# Patient Record
Sex: Female | Born: 1990
Health system: Southern US, Community
[De-identification: ages and names within clinical notes are randomized; demographics above are authoritative.]

## PROBLEM LIST (undated history)

## (undated) DIAGNOSIS — T7840XA Allergy, unspecified, initial encounter: Secondary | ICD-10-CM

## (undated) DIAGNOSIS — R42 Dizziness and giddiness: Secondary | ICD-10-CM

## (undated) HISTORY — DX: Allergy, unspecified, initial encounter: T78.40XA

## (undated) HISTORY — PX: TONSILLECTOMY: SUR1361

## (undated) HISTORY — PX: ADENOIDECTOMY: SUR15

---

## 2015-08-29 ENCOUNTER — Encounter (HOSPITAL_BASED_OUTPATIENT_CLINIC_OR_DEPARTMENT_OTHER): Payer: Self-pay

## 2015-08-29 ENCOUNTER — Emergency Department (HOSPITAL_BASED_OUTPATIENT_CLINIC_OR_DEPARTMENT_OTHER)
Admission: EM | Admit: 2015-08-29 | Discharge: 2015-08-30 | Disposition: A | Discharge status: Home / Self Care | Payer: Self-pay | Attending: Emergency Medicine | Admitting: Emergency Medicine

## 2015-08-29 DIAGNOSIS — R21 Rash and other nonspecific skin eruption: Secondary | ICD-10-CM | POA: Insufficient documentation

## 2015-08-29 DIAGNOSIS — Z880 Allergy status to penicillin: Secondary | ICD-10-CM | POA: Insufficient documentation

## 2015-08-29 NOTE — ED Notes (Signed)
Patient here with itchy rash to arms, legs, back and face. Also reports same on buttocks. Has tried benadryl for same and no relief, no distress

## 2015-08-30 MED ORDER — METHYLPREDNISOLONE 4 MG PO TBPK: ORAL_TABLET | ORAL | Status: DC

## 2015-08-30 MED ORDER — PREDNISONE 10 MG PO TABS
60.0000 mg | ORAL_TABLET | Freq: Once | ORAL | Status: DC
  Filled 2015-08-30 (×2): qty 1

## 2015-08-30 MED ORDER — PREDNISONE 10 MG PO TABS
60.0000 mg | ORAL_TABLET | Freq: Once | ORAL | Status: AC
  Administered 2015-08-30: 60 mg via ORAL

## 2015-08-30 NOTE — Discharge Instructions (Signed)
Contact Dermatitis °Contact dermatitis is a reaction to certain substances that touch the skin. Contact dermatitis can be either irritant contact dermatitis or allergic contact dermatitis. Irritant contact dermatitis does not require previous exposure to the substance for a reaction to occur. Allergic contact dermatitis only occurs if you have been exposed to the substance before. Upon a repeat exposure, your body reacts to the substance.  °CAUSES  °Many substances can cause contact dermatitis. Irritant dermatitis is most commonly caused by repeated exposure to mildly irritating substances, such as: °· Makeup. °· Soaps. °· Detergents. °· Bleaches. °· Acids. °· Metal salts, such as nickel. °Allergic contact dermatitis is most commonly caused by exposure to: °· Poisonous plants. °· Chemicals (deodorants, shampoos). °· Jewelry. °· Latex. °· Neomycin in triple antibiotic cream. °· Preservatives in products, including clothing. °SYMPTOMS  °The area of skin that is exposed may develop: °· Dryness or flaking. °· Redness. °· Cracks. °· Itching. °· Pain or a burning sensation. °· Blisters. °With allergic contact dermatitis, there may also be swelling in areas such as the eyelids, mouth, or genitals.  °DIAGNOSIS  °Your caregiver can usually tell what the problem is by doing a physical exam. In cases where the cause is uncertain and an allergic contact dermatitis is suspected, a patch skin test may be performed to help determine the cause of your dermatitis. °TREATMENT °Treatment includes protecting the skin from further contact with the irritating substance by avoiding that substance if possible. Barrier creams, powders, and gloves may be helpful. Your caregiver may also recommend: °· Steroid creams or ointments applied 2 times daily. For best results, soak the rash area in cool water for 20 minutes. Then apply the medicine. Cover the area with a plastic wrap. You can store the steroid cream in the refrigerator for a "chilly"  effect on your rash. That may decrease itching. Oral steroid medicines may be needed in more severe cases. °· Antibiotics or antibacterial ointments if a skin infection is present. °· Antihistamine lotion or an antihistamine taken by mouth to ease itching. °· Lubricants to keep moisture in your skin. °· Burow's solution to reduce redness and soreness or to dry a weeping rash. Mix one packet or tablet of solution in 2 cups cool water. Dip a clean washcloth in the mixture, wring it out a bit, and put it on the affected area. Leave the cloth in place for 30 minutes. Do this as often as possible throughout the day. °· Taking several cornstarch or baking soda baths daily if the area is too large to cover with a washcloth. °Harsh chemicals, such as alkalis or acids, can cause skin damage that is like a burn. You should flush your skin for 15 to 20 minutes with cold water after such an exposure. You should also seek immediate medical care after exposure. Bandages (dressings), antibiotics, and pain medicine may be needed for severely irritated skin.  °HOME CARE INSTRUCTIONS °· Avoid the substance that caused your reaction. °· Keep the area of skin that is affected away from hot water, soap, sunlight, chemicals, acidic substances, or anything else that would irritate your skin. °· Do not scratch the rash. Scratching may cause the rash to become infected. °· You may take cool baths to help stop the itching. °· Only take over-the-counter or prescription medicines as directed by your caregiver. °· See your caregiver for follow-up care as directed to make sure your skin is healing properly. °SEEK MEDICAL CARE IF:  °· Your condition is not better after 3   days of treatment.  You seem to be getting worse.  You see signs of infection such as swelling, tenderness, redness, soreness, or warmth in the affected area.  You have any problems related to your medicines. Document Released: 12/11/2000 Document Revised: 03/07/2012  Document Reviewed: 05/19/2011 [facility] Patient Information 2015 [place], [place]. This information is not intended to replace advice given to you by your health care provider. Make sure you discuss any questions you have with your health care provider.  Rash A rash is a change in the color or texture of your skin. There are many different types of rashes. You may have other problems that accompany your rash. CAUSES   Infections.  Allergic reactions. This can include allergies to pets or foods.  Certain medicines.  Exposure to certain chemicals, soaps, or cosmetics.  Heat.  Exposure to poisonous plants.  Tumors, both cancerous and noncancerous. SYMPTOMS   Redness.  Scaly skin.  Itchy skin.  Dry or cracked skin.  Bumps.  Blisters.  Pain. DIAGNOSIS  Your caregiver may do a physical exam to determine what type of rash you have. A skin sample (biopsy) may be taken and examined under a microscope. TREATMENT  Treatment depends on the type of rash you have. Your caregiver may prescribe certain medicines. For serious conditions, you may need to see a skin doctor (dermatologist). HOME CARE INSTRUCTIONS   Avoid the substance that caused your rash.  Do not scratch your rash. This can cause infection.  You may take cool baths to help stop itching.  Only take over-the-counter or prescription medicines as directed by your caregiver.  Keep all follow-up appointments as directed by your caregiver. SEEK IMMEDIATE MEDICAL CARE IF:  You have increasing pain, swelling, or redness.  You have a fever.  You have new or severe symptoms.  You have body aches, diarrhea, or vomiting.  Your rash is not better after 3 days. MAKE SURE YOU:  Understand these instructions.  Will watch your condition.  Will get help right away if you are not doing well or get worse. Document Released: 12/04/2002 Document Revised: 03/07/2012 Document Reviewed: 09/28/2011 [facility] Patient Information  2015 [place], [place]. This information is not intended to replace advice given to you by your health care provider. Make sure you discuss any questions you have with your health care provider.    Emergency Department Resource Guide 1) Find a Doctor and Pay Out of Pocket Although you won't have to find out who is covered by your insurance plan, it is a good idea to ask around and get recommendations. You will then need to call the office and see if the doctor you have chosen will accept you as a new patient and what types of options they offer for patients who are self-pay. Some doctors offer discounts or will set up payment plans for their patients who do not have insurance, but you will need to ask so you aren't surprised when you get to your appointment.  2) Contact Your Local Health Department Not all health departments have doctors that can see patients for sick visits, but many do, so it is worth a call to see if yours does. If you don't know where your local health department is, you can check in your phone book. The CDC also has a tool to help you locate your state's health department, and many state websites also have listings of all of their local health departments.  3) Find a Walk-in Clinic If your illness is not likely  to be very severe or complicated, you may want to try a walk in clinic. These are popping up all over the country in pharmacies, drugstores, and shopping centers. They're usually staffed by nurse practitioners or physician assistants that have been trained to treat common illnesses and complaints. They're usually fairly quick and inexpensive. However, if you have serious medical issues or chronic medical problems, these are probably not your best option.  No Primary Care Doctor: - Call Health Connect at  [phone] - they can help you locate a primary care doctor that  accepts your insurance, provides certain services, etc. - Physician Referral Service-  [phone]  Chronic Pain Problems: Organization         Address  Phone   Notes  [facility]  [phone] Patients need to be referred by their primary care doctor.   Medication Assistance: Organization         Address  Phone   Notes  [facility] [address] [place] [place], [place] [address] [phone] --Must be a resident of [facility] -- Must have NO insurance coverage whatsoever (no Medicaid/ Medicare, etc.) -- The pt. MUST have a primary care doctor that directs their care regularly and follows them in the community   [facility]  [phone]   [employer]  [phone]    Agencies that provide inexpensive medical care: Organization         Address  Phone   Notes  [facility]  [phone]   [facility]    [phone]   [facility] [address] [place], [place] [address] [phone]   [facility] [address], [place] [phone]   Planned Parenthood    [phone]   [facility]    [phone]   [facility]  [address], [place] Phone:  [phone], Fax:  [phone] Hours of Operation:  9 am - 6 pm, M-F.  Also accepts Medicaid/Medicare and self-pay.  [facility]  [address], [place] Phone: [phone], Fax: [phone]. Hours of Operation:  8:30 am - 5:30 pm, M-F.  Also accepts Medicaid and self-pay.  [facility] [place] [address], [place] Phone: [phone]   [facility] [address] Natasha Bence [place], [place] [phone], Ext. [phone] Mondays & Thursdays: 7-9 AM.  First 15 patients are seen on a first come, first serve basis.    Medicaid-accepting [facility] Providers:  Organization         Address  Phone   Notes  [facility] [address],  [place] [phone] Also accepts self-pay patients.  [facility] [address] Laurell Josephs [place], [place]  [phone]   [facility] [address], [place] [phone]   [facility] [address] [place] [phone]   Renaye Rakers [address], [place]   [phone] Only accepts [place] Access [place] patients after they have their name applied to their card.   Self-Pay (no insurance) in [facility]:  Organization         Address  Phone   Notes  Sickle Cell Patients, [facility] [address] [place], [place] [phone]   [facility] [address] [place], [place] [phone]   [facility]  [address], [place] [phone]   [facility]/Dr. Osei-Bonsu  [address], [place] or [address], [place] [phone] Phone number for both [place] and [place] locations is the same.  Urgent Medical and [facility] [address] [place] [phone]   [facility] [address], [place] or [address] [phone]   [facility] [address], [place] [phone], phone; [phone], fax Sees patients 1st and 3rd Saturday of every month.  Must not qualify for public or private insurance (i.e. Medicaid, Medicare, [place] Health Choice, Veterans' Benefits)  Household income should be no more than 200% of the poverty level The clinic cannot treat you if you are pregnant or think you are pregnant  Sexually transmitted diseases are not treated at the clinic.    Dental Care: Organization         Address  Phone  Notes  [facility] [address] [place], [place] [phone] Accepts children up to age 49 who are enrolled in  [place] or [place] Health Choice; pregnant women with a Medicaid card; and children who have applied for Medicaid or [place] Health Choice, but were declined, whose parents can pay a reduced fee at time of service.  [facility] Department of [facility]  [address], [place] [phone] Accepts children up to age 68 who are enrolled in [place] or [place] Health Choice; pregnant women with a Medicaid card; and children who have applied for Medicaid or  Health Choice, but were declined, whose parents can pay a reduced fee at time of service.  [facility]  [address] [place], [place] [phone] Patients are seen by appointment only. Walk-ins are not accepted. [facility] will see patients 93 years of age and older. Monday - Tuesday (8am-5pm) Most Wednesdays (8:30-5pm) $30 per visit, cash only  [facility] Adult Dental Access PRO[facility]  [address], [facility] [phone] Patients are seen by appointment only. Walk-ins are not accepted. [facility] will see patients 54 years of age and older. One Wednesday Evening (Monthly: Volunteer Based).  $30 per visit, cash only  [employer]  [phone] for adults; Children under age 25, call Graduate Pediatric Dentistry at [phone]. Children aged 57-14, please call [phone] to request a pediatric application.  Dental services are provided in all areas of dental care including fillings, crowns and bridges, complete and partial dentures, implants, gum treatment, root canals, and extractions. Preventive care is also provided. Treatment is provided to both adults and children. Patients are selected via a lottery and there is often a waiting list.   [facility] [address], [place]  [phone] [email or web address]   [facility] [address] [place], [place] [phone], Ext. [phone] Second and Fourth Thursday of each month, opens at 6:30  AM; Clinic ends at 9 AM.  Patients are seen on a first-come first-served basis, and a limited number are seen during each clinic.   [facility]  [address] Ether Griffins [place], [place] [phone]   Eligibility Requirements You must have lived in [place], [place], or [place] counties for at least the last three months.  You cannot be eligible for state or federal sponsored [place], including [employer], [place], or [employer].   You generally cannot be eligible for healthcare insurance through your employer.    How to apply: Eligibility screenings are held every Tuesday and Wednesday afternoon from 1:00 pm until 4:00 pm. You do not need an appointment for the interview!  [facility] [address] [place], [place] [phone]   [facility] Health Department  [phone]   [facility] Health Department  [phone]   [facility] Health Department  [phone]    Behavioral Health Resources in the Community: Intensive Outpatient Programs Organization         Address  Phone  Notes  [facility] [address] [place], [place] [phone]   [facility] [address] [place], [place] [phone]   [facility] [address] [place], [place]  [phone]   [facility] Mental Health [address]  [place], [place] [phone] or [phone]   Substance Abuse Resources Organization         Address  Phone  Notes  [facility]  [phone]   [facility]  [phone]   [facility] [place]  [phone]   Floydene Flock  [phone]   Residential & Outpatient Substance Abuse Program  [phone]   Psychological Services Organization         Address  Phone  Notes  [facility] Behavioral Health  [phone]   [facility] Services  [phone]   [facility] Mental Health [address], [place] [phone] or  [phone]    Mobile Crisis Teams Organization         Address  Phone  Notes  [facility], Mobile Crisis Care Unit  [phone]   [facility]  [address] [place], [place] [phone]   Doristine Locks [address] [place] [phone]    Self-Help/Support Groups Organization         Address  Phone             Notes  [facility] [place] - variety of support groups  [phone]- [phone] Call for more information  Narcotics Anonymous (NA), [facility] [address], [employer] [place]  2 meetings at this location   Statistician         Address  Phone  Notes  [facility] [address],    [place]  [phone]   [facility]  [address], [place] [address], [place], [place] [phone]   [facility] [address] [place], [place] [phone] Admissions: 8am-3pm M-F  [facility] [address]    [place], [place] [phone]   [facility] [address] [place], [place], [place] [phone]   [facility] [address]  [place], [place] [phone]   [facility] - Intensive Outpatient [address] Laurell Josephs [address], [place], [place] [phone]   [facility] ([facility] [address] [place]  [place], [place] [phone] or [phone]   [facility] ([facility]) [address] [place], [place] [phone] Accepts Medicaid  [facility] [address]  [place] [phone] Substance Abuse/Addiction Treatment   [facility] Organization         Address  Phone  Notes  [facility]  [phone]   Angie Fava, PhD [address]  [place]uanne Moron, [place]   [phone] or [phone]   [facility]   [address] [place], [place] [phone]   [facility] [address],  [place], [place] [phone] Insurance/Medicaid/sponsorship through [facility] and Families [address]                                    [place], [place] [phone] Therapy/tele-psych/case  [facility] [address], [place] [phone]    Dr. Lolly Mustache  [phone]   [facility] [place]  [facility] Dept. 1) [address] [place] 2) [address], [place] 3)  [address], [place] [phone]  [phone]   [facility] Child Abuse Hotline [phone] or [phone] (After Hours)

## 2015-08-30 NOTE — ED Provider Notes (Signed)
TIME SEEN: 12:10 AM  CHIEF COMPLAINT: Rash  HPI: Pt is a 24 y.o. female who presents the emergency department with a pruritic rash that is spread diffusely over her extremities and torso but sparing her face, palms and soles for the past week. She denies any new exposures to soaps, lotions, detergents, medications or foods. Has had similar rash in the past and was seen by a dermatologist but does not know her diagnosis. No else in the house has similar symptoms. Recently moved here from [place]. States that she has a brand-new mattress. No fever. No tick bites. No hives.  ROS: See HPI Constitutional: no fever  Eyes: no drainage  ENT: no runny nose   Cardiovascular:  no chest pain  Resp: no SOB  GI: no vomiting GU: no dysuria Integumentary:  rash  Allergy: no hives  Musculoskeletal: no leg swelling  Neurological: no slurred speech ROS otherwise negative  PAST MEDICAL HISTORY/PAST SURGICAL HISTORY:  History reviewed. No pertinent past medical history.  MEDICATIONS:  Prior to Admission medications   Not on File    ALLERGIES:  Allergies  Allergen Reactions  . Penicillins Rash    SOCIAL HISTORY:  Social History  Substance Use Topics  . Smoking status: Never Smoker   . Smokeless tobacco: Not on file  . Alcohol Use: Not on file    FAMILY HISTORY: No family history on file.  EXAM: BP 129/79 mmHg  Pulse 100  Temp(Src) 98.9 F (37.2 C) (Oral)  Resp 18  Ht  (1.626 m)  Wt 116 lb (52.617 kg)  BMI 19.90 kg/m2  SpO2 100% CONSTITUTIONAL: Alert and oriented and responds appropriately to questions. Well-appearing; well-nourished HEAD: Normocephalic EYES: Conjunctivae clear, PERRL ENT: normal nose; no rhinorrhea; moist mucous membranes; pharynx without lesions noted NECK: Supple, no meningismus, no LAD  CARD: RRR; S1 and S2 appreciated; no murmurs, no clicks, no rubs, no gallops RESP: Normal chest excursion without splinting or tachypnea; breath sounds clear and equal  bilaterally; no wheezes, no rhonchi, no rales, no hypoxia or respiratory distress, speaking full sentences ABD/GI: Normal bowel sounds; non-distended; soft, non-tender, no rebound, no guarding, no peritoneal signs BACK:  The back appears normal and is non-tender to palpation, there is no CVA tenderness EXT: Normal ROM in all joints; non-tender to palpation; no edema; normal capillary refill; no cyanosis, no calf tenderness or swelling    SKIN: Normal color for age and race; warm, no urticaria, no petechiae or purpura, no blisters or desquamation, no rash involving her mucous membranes, palms or soles, patient has diffuse scattered maculopapular rash to her extremities and torso that spares the face, some of these areas have excoriations but no warmth, induration, fluctuance or drainage NEURO: Moves all extremities equally, sensation to light touch intact diffusely, cranial nerves II through XII intact PSYCH: The patient's mood and manner are appropriate. Grooming and personal hygiene are appropriate.  MEDICAL DECISION MAKING: Patient here with rash that is nonspecific. Discussed with patient that some areas look like contact dermatitis where as other areas are a single papule. There is no sign of any infectious etiology. No sign of any life-threatening rash. Will discharge on steroid taper and have her continue Benadryl and over-the-counter hydrocortisone cream. Recommend outpatient follow-up with a primary care physician if symptoms continue. Have provided her with this information. Discussed return precautions. She verbalizes understanding and is comfortable with this plan.       Layla Maw Ward, DO 08/30/15 [phone]

## 2016-08-24 ENCOUNTER — Encounter: Payer: Self-pay | Admitting: Family Medicine

## 2016-08-24 ENCOUNTER — Ambulatory Visit (INDEPENDENT_AMBULATORY_CARE_PROVIDER_SITE_OTHER): Payer: 59 | Admitting: Family Medicine

## 2016-08-24 ENCOUNTER — Encounter (INDEPENDENT_AMBULATORY_CARE_PROVIDER_SITE_OTHER): Payer: Self-pay

## 2016-08-24 VITALS — BP 120/72 | HR 98 | Resp 18 | Ht 62.25 in | Wt 121.0 lb

## 2016-08-24 DIAGNOSIS — N910 Primary amenorrhea: Secondary | ICD-10-CM

## 2016-08-24 DIAGNOSIS — Z7189 Other specified counseling: Secondary | ICD-10-CM

## 2016-08-24 DIAGNOSIS — Z7689 Persons encountering health services in other specified circumstances: Secondary | ICD-10-CM

## 2016-08-24 LAB — POCT URINE PREGNANCY: Preg Test, Ur: POSITIVE — AB

## 2016-08-24 NOTE — Patient Instructions (Signed)
Call for any questions or problems.

## 2016-08-24 NOTE — Progress Notes (Signed)
Chief Complaint  Patient presents with  . Establish Care    previous with Dr. Garner Nash [facility])    Healthy 25 year old female here to establish care. She lives with her sister and is working on her master's in business administration. She works in [employer], [employer]. Her sister is an Rn. Kelly [name] is a healthy young woman who eats well, sleeps well. She has no ongoing medical problems. Past surgical history is tonsillectomy as a child She takes no prescription medications Family history is reviewed and is largely noncontributory   Last Pap was in 2015. Immunizations are up-to-date.  Only concern Kelly [name] expresses today is that her last menstrual period was in June. She has some breast fullness. No nausea. She has done 2 pregnancy test at home which were negative. She normally has regular menses. She and her significant other were not consistently using condoms for birth control. This is not a good time for her to be pregnant. She would like a pregnancy test, and birth control if appropriate.  There are no active problems to display for this patient.  No current outpatient prescriptions on file prior to visit.   No current facility-administered medications on file prior to visit.    Past Surgical History:  Procedure Laterality Date  . TONSILLECTOMY     under 12    Family History  Problem Relation Age of Onset  . Hypertension Maternal Grandmother   . Hypertension Maternal Grandfather   . Hypertension Paternal Grandmother   . Hypertension Paternal Grandfather    BP 120/72   Pulse 98   Resp 18   Ht 5' 2.25" (1.581 m)   Wt 121 lb (54.9 kg)   LMP 06/22/2016 (Within Days)   SpO2 100%   BMI 21.95 kg/m   Review of Systems  Constitutional: Negative for chills, fever and weight loss.       Mild decrease in appetite  HENT: Negative for congestion and hearing loss.   Eyes: Negative for blurred vision and pain.  Respiratory: Negative for cough and shortness of breath.    Cardiovascular: Negative for chest pain and leg swelling.  Gastrointestinal: Negative for abdominal pain, constipation, diarrhea, heartburn and nausea.  Genitourinary: Negative for dysuria and frequency.       Breast fullness.  Amenorrhea  Musculoskeletal: Negative for falls, joint pain and myalgias.  Neurological: Negative for dizziness, seizures and headaches.  Psychiatric/Behavioral: Negative for depression. The patient is not nervous/anxious and does not have insomnia.    Physical Exam  Constitutional: She is oriented to person, place, and time. She appears well-developed and well-nourished. She appears distressed.  Mild emotional lability upon learning diagnosis  HENT:  Head: Normocephalic and atraumatic.  Mouth/Throat: Oropharynx is clear and moist.  Neck: Normal range of motion. No thyromegaly present.  Cardiovascular: Normal rate, regular rhythm and normal heart sounds.   Pulmonary/Chest: Effort normal and breath sounds normal.  Lymphadenopathy:    She has no cervical adenopathy.  Neurological: She is alert and oriented to person, place, and time.  Psychiatric: She has a normal mood and affect. Her behavior is normal. Judgment and thought content normal.  Tearful   Urine pregnancy test is positive  1. Encounter to establish care with new doctor Office policies discussed  2. Amenorrhea, primary pregnant - POCT urine pregnancy  We had a discussion about her early pregnancy. She does not wish to be pregnant. We discussed her choices. By calculation she is 8-[redacted] weeks pregnant. It is possible she could have a chemical pregnancy termination  versus surgical. [facility] to [facility] her choices. I encouraged her to engage in limited family or friends for support. Discussed the emotional magnitude of this diagnosis and her decisions. Patient feels she has good support in her sister and her significant other.  Patient Instructions  Call for any questions or  problems

## 2016-10-12 ENCOUNTER — Encounter: Payer: 59 | Admitting: Family Medicine

## 2016-10-28 ENCOUNTER — Encounter: Payer: 59 | Admitting: Family Medicine

## 2017-03-16 ENCOUNTER — Telehealth: Payer: Self-pay | Admitting: Family Medicine

## 2017-03-16 NOTE — Telephone Encounter (Signed)
Called to r/s appt due to the office being closed (03/26/17)

## 2017-03-26 ENCOUNTER — Ambulatory Visit: Payer: 59 | Admitting: Family Medicine

## 2017-04-20 ENCOUNTER — Ambulatory Visit (INDEPENDENT_AMBULATORY_CARE_PROVIDER_SITE_OTHER): Payer: 59 | Admitting: Family Medicine

## 2017-04-20 ENCOUNTER — Encounter: Payer: Self-pay | Admitting: Family Medicine

## 2017-04-20 VITALS — BP 114/64 | HR 88 | Temp 98.5°F | Resp 16 | Ht 62.0 in | Wt 120.1 lb

## 2017-04-20 DIAGNOSIS — L700 Acne vulgaris: Secondary | ICD-10-CM | POA: Diagnosis not present

## 2017-04-20 DIAGNOSIS — Z30011 Encounter for initial prescription of contraceptive pills: Secondary | ICD-10-CM

## 2017-04-20 MED ORDER — MINOCYCLINE HCL 100 MG PO CAPS: 100.0000 mg | ORAL_CAPSULE | Freq: Every day | ORAL | 3 refills | Status: DC

## 2017-04-20 MED ORDER — LEVONORGEST-ETH ESTRAD 91-DAY 0.15-0.03 MG PO TABS: 1.0000 | ORAL_TABLET | Freq: Every day | ORAL | 4 refills | Status: DC

## 2017-04-20 NOTE — Patient Instructions (Signed)
Take medicine once a day Call for problems See me yearly

## 2017-04-20 NOTE — Progress Notes (Signed)
Chief Complaint  Patient presents with  . Follow-up  Healthy 26 year old. Here for follow-up. Is feeling well. She has 2 issues today. Her she would like to take something for her acne. It's on her face and her back. She has tried topical products but cannot distribute them well on her back. Her prior physician talked about a pill she could take. We discussed taking a tetracycline antibiotic. We discussed the importance of not getting pregnant. We discussed the importance of taking it with food. She will try minocycline 100 mg a day. She would also like to get on birth control pills. She is single. She is seeing a new gentleman. She would like to be careful about getting pregnant. I discussed that with safe sex she really needs to use condoms. I will prescribe oral contraceptives, however. They may help her acne. She has taken them before. She doesn't smoke cigarettes. No history of stroke. No history of hypertension. No history of migraine with our. No contraindications to oral contraceptive   Patient Active Problem List   Diagnosis Date Noted  . Acne vulgaris 04/20/2017    Outpatient Encounter Prescriptions as of 04/20/2017  Medication Sig  . levonorgestrel-ethinyl estradiol (SEASONALE,INTROVALE,JOLESSA) 0.15-0.03 MG tablet Take 1 tablet by mouth daily.  . minocycline (MINOCIN,DYNACIN) 100 MG capsule Take 1 capsule (100 mg total) by mouth daily.   No facility-administered encounter medications on file as of 04/20/2017.     Allergies  Allergen Reactions  . Penicillins Rash    Review of Systems  Constitutional: Negative for activity change, appetite change and unexpected weight change.  HENT: Negative for congestion, dental problem, postnasal drip and rhinorrhea.   Eyes: Negative for redness and visual disturbance.  Respiratory: Negative for cough and shortness of breath.   Cardiovascular: Negative for chest pain, palpitations and leg swelling.  Gastrointestinal: Negative for  abdominal pain, constipation and diarrhea.  Genitourinary: Negative for difficulty urinating, frequency and menstrual problem.  Musculoskeletal: Negative for arthralgias and back pain.  Skin:       Acne  Neurological: Negative for dizziness and headaches.  Psychiatric/Behavioral: Negative for dysphoric mood and sleep disturbance. The patient is not nervous/anxious.       BP 114/64 (BP Location: Right Arm, Patient Position: Sitting, Cuff Size: Normal)   Pulse 88   Temp 98.5 F (36.9 C) (Temporal)   Resp 16   Ht  (1.575 m)   Wt 120 lb 1.9 oz (54.5 kg)   LMP 03/26/2017 (Exact Date)   SpO2 100%   BMI 21.97 kg/m   Physical Exam  Constitutional: She is oriented to person, place, and time. She appears well-developed and well-nourished. No distress.  HENT:  Head: Normocephalic and atraumatic.  Mouth/Throat: Oropharynx is clear and moist.  Eyes: Conjunctivae are normal. Pupils are equal, round, and reactive to light.  Neck: Normal range of motion. No thyromegaly present.  Cardiovascular: Normal rate, regular rhythm and normal heart sounds.   Pulmonary/Chest: Effort normal and breath sounds normal.  Musculoskeletal: Normal range of motion. She exhibits no edema.  Lymphadenopathy:    She has no cervical adenopathy.  Neurological: She is alert and oriented to person, place, and time.  Skin:  Scattered small papules and comedones on face and upper back with few pustules  Psychiatric: She has a normal mood and affect. Her behavior is normal.    ASSESSMENT/PLAN:  1. Acne vulgaris   2. Oral contraception initiation We discussed taking oral contraceptives daily. Discussed Sunday start. We discussed missed  pills. We discussed side effects. Discussed reasons for her to call.   Patient Instructions  Take medicine once a day Call for problems See me yearly   Eustace Moore, MD

## 2017-05-31 ENCOUNTER — Telehealth: Payer: Self-pay | Admitting: Family Medicine

## 2017-05-31 NOTE — Telephone Encounter (Signed)
Noted.  Pend new OCP

## 2017-05-31 NOTE — Telephone Encounter (Signed)
Minocycline is making her sick with and without food. She is requesting to come off the minocycline, and switch her birth control to orthotrycicline.  Cb#: [phone]

## 2017-06-01 MED ORDER — NORGESTIM-ETH ESTRAD TRIPHASIC 0.18/0.215/0.25 MG-35 MCG PO TABS
1.0000 | ORAL_TABLET | Freq: Every day | ORAL | 11 refills | Status: DC
Start: 2017-06-01 — End: 2017-10-13

## 2017-06-01 NOTE — Telephone Encounter (Signed)
rx done, called brittany, aware.

## 2017-07-05 ENCOUNTER — Encounter: Payer: Self-pay | Admitting: Family Medicine

## 2017-08-05 ENCOUNTER — Encounter: Payer: Self-pay | Admitting: Family Medicine

## 2017-10-07 ENCOUNTER — Other Ambulatory Visit: Payer: Self-pay

## 2017-10-07 NOTE — Telephone Encounter (Signed)
Left message for pt to clarify what pharmacy she is using.

## 2017-10-08 DIAGNOSIS — Z6820 Body mass index (BMI) 20.0-20.9, adult: Secondary | ICD-10-CM | POA: Diagnosis not present

## 2017-10-08 DIAGNOSIS — L700 Acne vulgaris: Secondary | ICD-10-CM | POA: Diagnosis not present

## 2017-10-13 ENCOUNTER — Other Ambulatory Visit: Payer: Self-pay

## 2017-10-13 MED ORDER — NORGESTIM-ETH ESTRAD TRIPHASIC 0.18/0.215/0.25 MG-35 MCG PO TABS: 1.0000 | ORAL_TABLET | Freq: Every day | ORAL | 1 refills | Status: DC

## 2017-10-13 NOTE — Telephone Encounter (Signed)
Seen [phone] 24 18 

## 2017-10-27 ENCOUNTER — Other Ambulatory Visit: Payer: Self-pay | Admitting: Family Medicine

## 2017-12-03 ENCOUNTER — Other Ambulatory Visit: Payer: Self-pay | Admitting: Obstetrics and Gynecology

## 2017-12-03 ENCOUNTER — Ambulatory Visit (INDEPENDENT_AMBULATORY_CARE_PROVIDER_SITE_OTHER): Payer: BLUE CROSS/BLUE SHIELD | Admitting: Obstetrics and Gynecology

## 2017-12-03 ENCOUNTER — Other Ambulatory Visit: Payer: Self-pay | Admitting: Women's Health

## 2017-12-03 ENCOUNTER — Encounter: Payer: Self-pay | Admitting: Obstetrics and Gynecology

## 2017-12-03 ENCOUNTER — Other Ambulatory Visit (HOSPITAL_COMMUNITY)
Admission: RE | Admit: 2017-12-03 | Discharge: 2017-12-03 | Disposition: A | Discharge status: Home / Self Care | Payer: BLUE CROSS/BLUE SHIELD | Source: Ambulatory Visit | Attending: Obstetrics and Gynecology | Admitting: Obstetrics and Gynecology

## 2017-12-03 VITALS — BP 110/80 | HR 98 | Ht 63.0 in | Wt 119.6 lb

## 2017-12-03 DIAGNOSIS — B3731 Acute candidiasis of vulva and vagina: Secondary | ICD-10-CM

## 2017-12-03 DIAGNOSIS — Z01411 Encounter for gynecological examination (general) (routine) with abnormal findings: Secondary | ICD-10-CM

## 2017-12-03 DIAGNOSIS — IMO0002 Reserved for concepts with insufficient information to code with codable children: Secondary | ICD-10-CM

## 2017-12-03 DIAGNOSIS — B373 Candidiasis of vulva and vagina: Secondary | ICD-10-CM | POA: Diagnosis not present

## 2017-12-03 DIAGNOSIS — R229 Localized swelling, mass and lump, unspecified: Principal | ICD-10-CM

## 2017-12-03 DIAGNOSIS — Z3009 Encounter for other general counseling and advice on contraception: Secondary | ICD-10-CM | POA: Insufficient documentation

## 2017-12-03 DIAGNOSIS — N6320 Unspecified lump in the left breast, unspecified quadrant: Secondary | ICD-10-CM | POA: Diagnosis not present

## 2017-12-03 MED ORDER — FLUCONAZOLE 150 MG PO TABS
150.0000 mg | ORAL_TABLET | Freq: Once | ORAL | 1 refills | Status: AC
Start: 2017-12-03 — End: 2017-12-03

## 2017-12-03 NOTE — Progress Notes (Signed)
  Assessment:  1.  Annual Gyn Exam 2.  Monilial vaginitis 3   3 cm left breast cyst  Plan:  1. pap smear done, next pap due 3 years 2. return annually or prn 3   Annual mammogram advised after age 26 4   Pt advised to try Lo-loestrin next if needed 5    Left breast U/S scheduled at [facility] 6    Diflucan rx   Subjective:  Kelly [name] is a 26 y.o. female No obstetric history on file. who presents for annual exam. Patient's last menstrual period was 11/03/2017. The patient has no complaints today. She did note some discharge this morning, and believes it may be a yeast infection.  She takes Lillow for oral contraceptives and to manage her facial acne. She previously took ortho tri-cyclen to help her acne, but states she bled for 6 month while on it. She has used topical gels for her facial and back acne. Pt does not do self breast exams.   The following portions of the patient's history were reviewed and updated as appropriate: allergies, current medications, past family history, past medical history, past social history, past surgical history and problem list. Past Medical History:  Diagnosis Date  . Allergy    penicillin    Past Surgical History:  Procedure Laterality Date  . TONSILLECTOMY     under 12      Current Outpatient Medications:  .  LILLOW 0.15-30 MG-MCG tablet, TAKE 1 TABLET BY MOUTH EVERY DAY, Disp: 28 tablet, Rfl: 10 .  minocycline (MINOCIN,DYNACIN) 100 MG capsule, Take 1 capsule (100 mg total) by mouth daily. (Patient not taking: Reported on 12/03/2017), Disp: 90 capsule, Rfl: 3 .  Norgestimate-Ethinyl Estradiol Triphasic (ORTHO TRI-CYCLEN, 28,) 0.18/0.215/0.25 MG-35 MCG tablet, Take 1 tablet by mouth daily. (Patient not taking: Reported on 12/03/2017), Disp: 90 Package, Rfl: 1  Review of Systems Constitutional: negative Gastrointestinal: negative Genitourinary: negative  Objective:  BP 110/80 (BP Location: Right Arm, Patient Position: Sitting, Cuff  Size: Small)   Pulse 98   Ht 5\' 3"  (1.6 m)   Wt 119 lb 9.6 oz (54.3 kg)   LMP 11/03/2017   BMI 21.19 kg/m    BMI: Body mass index is 21.19 kg/m.  General Appearance: Alert, appropriate appearance for age. No acute distress HEENT: Grossly normal Back: No CVAT Breast Exam: .No dimpling, nipple retraction or discharge. 3 cm left breast cyst present Gastrointestinal: Soft, non-tender, no masses or organomegaly Pelvic Exam:  External genitalia: normal general appearance, some redness Vaginal: normal mucosa without prolapse or lesions Cervix: normal appearance,  Adnexa: normal bimanual exam Uterus: normal single, nontender, tiny Rectovaginal: not indicated Lymphatic Exam: Non-palpable nodes in neck, clavicular, axillary, or inguinal regions Skin: no rash or abnormalities Neurologic: Normal gait and speech, no tremor  Psychiatric: Alert and oriented, appropriate affect.  Urinalysis:Not done   KOH/Wet Prep: +yeast -whiff Lots of red blood cells, yeast present   Christin BachJohn Ionia Schey. MD Pgr [phone] 11:14 AM   By signing my name below, I, Redge GainerIzna Ahmed, attest that this documentation has been prepared under the direction and in the presence of Tilda BurrowFerguson, Stevi Hollinshead V, MD. Electronically Signed: Redge GainerIzna Ahmed, Medical Scribe. 12/03/17. 11:14 AM.  I personally performed the services described in this documentation, which was SCRIBED in my presence. The recorded information has been reviewed and considered accurate. It has been edited as necessary during review. Tilda BurrowJohn V Deland Slocumb, MD

## 2017-12-03 NOTE — Addendum Note (Signed)
Addended by: Federico FlakeNES, Nimo Verastegui A on: 12/03/2017 12:18 PM   Modules accepted: Orders

## 2017-12-07 LAB — CYTOLOGY - PAP: Diagnosis: NEGATIVE

## 2017-12-11 DIAGNOSIS — R590 Localized enlarged lymph nodes: Secondary | ICD-10-CM | POA: Diagnosis not present

## 2017-12-12 ENCOUNTER — Encounter: Payer: Self-pay | Admitting: Family Medicine

## 2017-12-13 ENCOUNTER — Telehealth: Payer: Self-pay

## 2017-12-13 NOTE — Telephone Encounter (Signed)
Called and spoke to brittany,offered an appt with dr hagler, as dr Delton Seenelson is off, wanted to come tomorrow at 4 pm, no appt available at that time. Advised to call back if she changes her mind.

## 2017-12-14 ENCOUNTER — Ambulatory Visit (HOSPITAL_COMMUNITY)
Admission: RE | Admit: 2017-12-14 | Discharge: 2017-12-14 | Disposition: A | Discharge status: Home / Self Care | Payer: BLUE CROSS/BLUE SHIELD | Source: Ambulatory Visit | Attending: Obstetrics and Gynecology | Admitting: Obstetrics and Gynecology

## 2017-12-14 ENCOUNTER — Other Ambulatory Visit (HOSPITAL_COMMUNITY): Payer: Self-pay

## 2017-12-14 DIAGNOSIS — R229 Localized swelling, mass and lump, unspecified: Principal | ICD-10-CM

## 2017-12-14 DIAGNOSIS — N6489 Other specified disorders of breast: Secondary | ICD-10-CM | POA: Diagnosis not present

## 2017-12-14 DIAGNOSIS — IMO0002 Reserved for concepts with insufficient information to code with codable children: Secondary | ICD-10-CM

## 2017-12-31 DIAGNOSIS — J029 Acute pharyngitis, unspecified: Secondary | ICD-10-CM | POA: Diagnosis not present

## 2017-12-31 DIAGNOSIS — R52 Pain, unspecified: Secondary | ICD-10-CM | POA: Diagnosis not present

## 2018-01-04 ENCOUNTER — Encounter: Payer: Self-pay | Admitting: Family Medicine

## 2018-01-05 ENCOUNTER — Other Ambulatory Visit: Payer: Self-pay | Admitting: Family Medicine

## 2018-01-05 ENCOUNTER — Other Ambulatory Visit: Payer: Self-pay

## 2018-01-05 MED ORDER — BENZONATATE 100 MG PO CAPS
100.0000 mg | ORAL_CAPSULE | Freq: Three times a day (TID) | ORAL | 0 refills | Status: DC | PRN
Start: 2018-01-05 — End: 2018-01-05

## 2018-01-05 MED ORDER — BENZONATATE 100 MG PO CAPS: 100.0000 mg | ORAL_CAPSULE | Freq: Three times a day (TID) | ORAL | 0 refills | Status: DC | PRN

## 2018-01-26 ENCOUNTER — Encounter: Payer: Self-pay | Admitting: Family Medicine

## 2018-01-28 ENCOUNTER — Other Ambulatory Visit: Payer: Self-pay | Admitting: Family Medicine

## 2018-01-28 MED ORDER — LEVONORGEST-ETH ESTRAD 91-DAY 0.15-0.03 MG PO TABS: 1.0000 | ORAL_TABLET | Freq: Every day | ORAL | 4 refills | Status: DC

## 2018-02-26 ENCOUNTER — Telehealth: Payer: Self-pay | Admitting: Family Medicine

## 2018-02-26 MED ORDER — OSELTAMIVIR PHOSPHATE 75 MG PO CAPS: 75.0000 mg | ORAL_CAPSULE | Freq: Two times a day (BID) | ORAL | 0 refills | Status: DC

## 2018-02-26 NOTE — Telephone Encounter (Signed)
Patient called with 2 days of body aches, cough, fatigue, fever, mild headache. 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 Wants tamiflu called into pharmacy.

## 2018-02-27 DIAGNOSIS — J09X2 Influenza due to identified novel influenza A virus with other respiratory manifestations: Secondary | ICD-10-CM | POA: Diagnosis not present

## 2018-02-27 DIAGNOSIS — J111 Influenza due to unidentified influenza virus with other respiratory manifestations: Secondary | ICD-10-CM | POA: Diagnosis not present

## 2018-05-15 ENCOUNTER — Encounter: Payer: Self-pay | Admitting: Family Medicine

## 2018-05-17 NOTE — Telephone Encounter (Signed)
Please call patient and advised that I would recommend she follow-up to discuss the acne.  In addition, it would be most beneficial so that I can evaluate her skin.  Please advise her that there are some wonderful medicines to help with acne and also to decrease any skin issues/pigmentation changes that have been associated with acne scars.  Please advise her to schedule office visit.

## 2018-05-19 ENCOUNTER — Telehealth: Payer: Self-pay | Admitting: Family Medicine

## 2018-05-19 NOTE — Telephone Encounter (Signed)
Pt is calling in was a Dr Delton See Pt, and was moved to Dr Tracie Harrier, but said that Dr Lodema Hong said she would see her?? Please advise, I offered her an appt with Dr Tracie Harrier on June 11 @ 1:20  She wants to wait to see what Dr Lodema Hong advises

## 2018-05-24 ENCOUNTER — Encounter (INDEPENDENT_AMBULATORY_CARE_PROVIDER_SITE_OTHER): Payer: Self-pay

## 2018-05-24 ENCOUNTER — Telehealth: Payer: Self-pay | Admitting: Family Medicine

## 2018-05-24 NOTE — Telephone Encounter (Signed)
I tried to call patient several times today and could not get her.  Dr Lodema Hong said she will see this patient as a new patient.  I did send a my-chart message to her as well.  When she calls in the next appointment time is July 3rd.

## 2018-05-31 ENCOUNTER — Encounter: Payer: Self-pay | Admitting: Family Medicine

## 2018-06-03 ENCOUNTER — Encounter: Payer: Self-pay | Admitting: Family Medicine

## 2018-06-19 DIAGNOSIS — R51 Headache: Secondary | ICD-10-CM | POA: Diagnosis not present

## 2018-06-19 DIAGNOSIS — R42 Dizziness and giddiness: Secondary | ICD-10-CM | POA: Diagnosis not present

## 2018-06-20 ENCOUNTER — Encounter: Payer: Self-pay | Admitting: Family Medicine

## 2018-06-22 ENCOUNTER — Emergency Department (HOSPITAL_BASED_OUTPATIENT_CLINIC_OR_DEPARTMENT_OTHER)
Admission: EM | Admit: 2018-06-22 | Discharge: 2018-06-22 | Disposition: A | Discharge status: Home / Self Care | Payer: BLUE CROSS/BLUE SHIELD | Attending: Emergency Medicine | Admitting: Emergency Medicine

## 2018-06-22 ENCOUNTER — Other Ambulatory Visit: Payer: Self-pay

## 2018-06-22 ENCOUNTER — Emergency Department (HOSPITAL_BASED_OUTPATIENT_CLINIC_OR_DEPARTMENT_OTHER): Payer: BLUE CROSS/BLUE SHIELD

## 2018-06-22 ENCOUNTER — Encounter (HOSPITAL_BASED_OUTPATIENT_CLINIC_OR_DEPARTMENT_OTHER): Payer: Self-pay

## 2018-06-22 DIAGNOSIS — R42 Dizziness and giddiness: Secondary | ICD-10-CM

## 2018-06-22 DIAGNOSIS — Z79899 Other long term (current) drug therapy: Secondary | ICD-10-CM | POA: Diagnosis not present

## 2018-06-22 DIAGNOSIS — R51 Headache: Secondary | ICD-10-CM | POA: Diagnosis not present

## 2018-06-22 DIAGNOSIS — R519 Headache, unspecified: Secondary | ICD-10-CM

## 2018-06-22 DIAGNOSIS — Z3202 Encounter for pregnancy test, result negative: Secondary | ICD-10-CM | POA: Diagnosis not present

## 2018-06-22 MED ORDER — KETOROLAC TROMETHAMINE 60 MG/2ML IM SOLN
30.0000 mg | Freq: Once | INTRAMUSCULAR | Status: AC
  Administered 2018-06-22: 30 mg via INTRAMUSCULAR
  Filled 2018-06-22: qty 2

## 2018-06-22 MED ORDER — KETOROLAC TROMETHAMINE 30 MG/ML IJ SOLN: 15.0000 mg | Freq: Once | INTRAMUSCULAR | Status: DC

## 2018-06-22 MED ORDER — MECLIZINE HCL 25 MG PO TABS
25.0000 mg | ORAL_TABLET | Freq: Once | ORAL | Status: AC
  Administered 2018-06-22: 25 mg via ORAL
  Filled 2018-06-22: qty 1

## 2018-06-22 MED ORDER — MECLIZINE HCL 25 MG PO TABS: 25.0000 mg | ORAL_TABLET | Freq: Three times a day (TID) | ORAL | 0 refills | Status: DC | PRN

## 2018-06-22 NOTE — ED Triage Notes (Signed)
C/o dizziness, light headed x 1 week-also c/o HA-was [facility] days[facility]-ws advised by PCP to come to ED-pt reports she was in [place] May 27- June 3-NAD-steady gait

## 2018-06-22 NOTE — Discharge Instructions (Signed)
Take Meclizine as directed for dizziness Take Ibuprofen as needed for headaches Please follow up with neurology if your symptoms are not improving

## 2018-06-22 NOTE — ED Provider Notes (Signed)
[facility] EMERGENCY DEPARTMENT Provider Note   CSN: [id number] Arrival date & time: 06/22/18  2012     History   Chief Complaint Chief Complaint  Patient presents with  . Dizziness    [name] is a 27 y.o. female who presents with headache and dizziness.  No significant past medical history.  She states for the past 2 weeks she has had intermittent headaches.  It is over the bilateral temples.  Feels like a tightness or squeezing.  She denies history of similar headaches.  About 1 week ago she started having dizziness and felt like the room is spinning.  She feels like the room is spinning no matter what position she is in.  She also has states she feels lightheaded like she is going to pass out at the same time.  She went to the [place] at the beginning of the month returned on June 3.  She had an episode of food poisoning while she was there and had nausea and vomiting and this is resolved.  The patient has intermittent blurry vision, fatigue, generalized weakness.  She has not had a fever, neck pain, URI symptoms, numbness, tingling, urine unilateral weakness.  She is gone to urgent care and was told to take Tylenol but this has not helped her symptoms.  Ibuprofen will help with her headache but not her dizziness.  She sent a message to her primary doctor who advised her to come to the emergency department.  [facility] but left without being seen due to the wait times.  She had blood work done which was unremarkable.  Her sister is at bedside and is wondering if the patient may have a Chiari malformation because she has this.  HPI  Past Medical History:  Diagnosis Date  . Allergy    penicillin    Patient Active Problem List   Diagnosis Date Noted  . Encounter for well woman exam with abnormal findings 12/03/2017  . Acne vulgaris 04/20/2017    Past Surgical History:  Procedure Laterality Date  . TONSILLECTOMY     under 12      OB History   None      Home Medications    Prior to Admission medications   Medication Sig Start Date End Date Taking? Authorizing Provider  benzonatate (TESSALON) 100 MG capsule Take 1 capsule (100 mg total) by mouth 3 (three) times daily as needed for cough. 01/05/18   Eustace MooreNelson, Yvonne Sue, MD  levonorgestrel-ethinyl estradiol (SEASONALE,INTROVALE,JOLESSA) 0.15-0.03 MG tablet Take 1 tablet by mouth daily. 01/28/18   Eustace MooreNelson, Yvonne Sue, MD  minocycline (MINOCIN,DYNACIN) 100 MG capsule Take 1 capsule (100 mg total) by mouth daily. Patient not taking: Reported on 12/03/2017 04/20/17   Eustace MooreNelson, Yvonne Sue, MD  oseltamivir (TAMIFLU) 75 MG capsule Take 1 capsule (75 mg total) by mouth 2 (two) times daily. 02/26/18   Aliene BeamsHagler, Rachel, MD    Family History Family History  Problem Relation Age of Onset  . Hypertension Maternal Grandmother   . Hypertension Maternal Grandfather   . Hypertension Paternal Grandmother   . Hypertension Paternal Grandfather     Social History Social History   Tobacco Use  . Smoking status: Never Smoker  . Smokeless tobacco: Never Used  Substance Use Topics  . Alcohol use: No  . Drug use: No     Allergies   Penicillins   Review of Systems Review of Systems  Constitutional: Positive for fatigue. Negative  for fever.  Respiratory: Negative for shortness of breath.   Cardiovascular: Negative for chest pain.  Gastrointestinal: Negative for abdominal pain.  Musculoskeletal: Negative for neck pain and neck stiffness.  Neurological: Positive for dizziness, weakness, light-headedness and headaches. Negative for syncope and numbness.  All other systems reviewed and are negative.    Physical Exam Updated Vital Signs BP (!) 135/94 (BP Location: Left Arm)   Pulse 96   Temp 98.5 F (36.9 C) (Oral)   Resp 18   Ht 5\' 2"  (1.575 m)   Wt 53.5 kg (118 lb)   SpO2 96%   BMI 21.58 kg/m   Physical Exam  Constitutional: She is oriented to person,  place, and time. She appears well-developed and well-nourished. No distress.  HENT:  Head: Normocephalic and atraumatic.  Eyes: Pupils are equal, round, and reactive to light. Conjunctivae are normal. Right eye exhibits no discharge. Left eye exhibits no discharge. No scleral icterus.  Neck: Normal range of motion.  Cardiovascular: Regular rhythm. Tachycardia present.  Pulmonary/Chest: Effort normal and breath sounds normal. No respiratory distress.  Abdominal: Soft. Bowel sounds are normal. She exhibits no distension. There is no tenderness.  Neurological: She is alert and oriented to person, place, and time.  Mental Status:  Alert, oriented, thought content appropriate, able to give a coherent history. Speech fluent without evidence of aphasia. Able to follow 2 step commands without difficulty.  Cranial Nerves:  II:  Peripheral visual fields grossly normal, pupils equal, round, reactive to light III,IV, VI: ptosis not present, extra-ocular motions intact bilaterally  V,VII: smile symmetric, facial light touch sensation equal VIII: hearing grossly normal to voice  X: uvula elevates symmetrically  XI: bilateral shoulder shrug symmetric and strong XII: midline tongue extension without fassiculations Motor:  Normal tone. 5/5 in upper and lower extremities bilaterally including strong and equal grip strength and dorsiflexion/plantar flexion Sensory: Pinprick and light touch normal in all extremities.  Cerebellar: normal finger-to-nose with bilateral upper extremities Gait: normal gait and balance CV: distal pulses palpable throughout    Skin: Skin is warm and dry.  Psychiatric: She has a normal mood and affect. Her behavior is normal.  Nursing note and vitals reviewed.    ED Treatments / Results  Labs (all labs ordered are listed, but only abnormal results are displayed) Labs Reviewed - No data to display  EKG None  Radiology Ct Head Wo Contrast  Result Date:  06/22/2018 CLINICAL DATA:  Frontal headache and vertigo.  Blurry vision. EXAM: CT HEAD WITHOUT CONTRAST TECHNIQUE: Contiguous axial images were obtained from the base of the skull through the vertex without intravenous contrast. COMPARISON:  None. FINDINGS: Brain: There is no mass, hemorrhage or extra-axial collection. The size and configuration of the ventricles and extra-axial CSF spaces are normal. There is no acute or chronic infarction. The brain parenchyma is normal. Vascular: No abnormal hyperdensity of the major intracranial arteries or dural venous sinuses. No intracranial atherosclerosis. Skull: The visualized skull base, calvarium and extracranial soft tissues are normal. Sinuses/Orbits: No fluid levels or advanced mucosal thickening of the visualized paranasal sinuses. No mastoid or middle ear effusion. The orbits are normal. IMPRESSION: Normal head CT. Electronically Signed   By: Deatra Robinson M.D.   On: 06/22/2018 22:59    Procedures Procedures (including critical care time)  Medications Ordered in ED Medications  meclizine (ANTIVERT) tablet 25 mg (25 mg Oral Given 06/22/18 2132)  ketorolac (TORADOL) injection 30 mg (30 mg Intramuscular Given 06/22/18 2133)  Initial Impression / Assessment and Plan / ED Course  I have reviewed the triage vital signs and the nursing notes.  Pertinent labs & imaging results that were available during my care of the patient were reviewed by me and considered in my medical decision making (see chart for details).  27 year old female with persistent dizziness and intermittent headaches for 1 to 2 weeks.  Her vital signs are normal.  Her neurologic exam is normal.  Outside records were reviewed.  She has had recent blood work and urinalysis and pregnancy test today.  These were overall normal and reassuring.  Advised that the only tests that we could add that would be different will be imaging which she is agreeable to.  She was given IM Toradol and  meclizine as well.  CT head is negative.  She reports improvement in her symptoms after Toradol meclizine.  We will give her a prescription for meclizine and advised follow-up with neurology.  Final Clinical Impressions(s) / ED Diagnoses   Final diagnoses:  Dizziness  Acute nonintractable headache, unspecified headache type    ED Discharge Orders    None       Bethel Born, PA-C 06/22/18 2342    Melene Plan, DO 06/22/18 2348

## 2018-06-29 ENCOUNTER — Ambulatory Visit: Payer: Self-pay | Admitting: Family Medicine

## 2018-07-14 ENCOUNTER — Encounter: Payer: Self-pay | Admitting: Family Medicine

## 2018-07-14 ENCOUNTER — Other Ambulatory Visit: Payer: Self-pay

## 2018-07-14 ENCOUNTER — Ambulatory Visit: Payer: BLUE CROSS/BLUE SHIELD | Admitting: Family Medicine

## 2018-07-14 VITALS — BP 112/80 | HR 99 | Resp 12 | Ht 63.0 in | Wt 121.1 lb

## 2018-07-14 DIAGNOSIS — L700 Acne vulgaris: Secondary | ICD-10-CM | POA: Diagnosis not present

## 2018-07-14 NOTE — Patient Instructions (Addendum)
F/U in 6 months, call if you need me before.  I will message you over the weekend with my proposal for treatment of your acne. 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      Acne Acne is a skin problem that causes small, red bumps (pimples). Acne happens when the tiny holes in your skin (pores) get blocked. Your pores may become red, sore, and swollen. They may also become infected. Acne is a common skin problem. It is especially common in teenagers. Acne usually goes away over time. Follow these instructions at home: Good skin care is the most important thing you can do to treat your acne. Take care of your skin as told by your doctor. You may be told to do these things:  Wash your skin gently at least two times each day. You should also wash your skin: ? After you exercise. ? Before you go to bed.  Use mild soap.  Use a water-based skin moisturizer after you wash your skin.  Use a sunscreen or sunblock with SPF 30 or greater. This is very important if you are using acne medicines.  Choose cosmetics that will not plug your oil glands (are noncomedogenic).  Medicines  Take over-the-counter and prescription medicines only as told by your doctor.  If you were prescribed an antibiotic medicine, apply or take it as told by your doctor. Do not stop using the antibiotic even if your acne improves. General instructions  Keep your hair clean and off of your face. Shampoo your hair regularly. If you have oily hair, you may need to wash it every day.  Avoid leaning your chin or forehead on your hands.  Avoid wearing tight headbands or hats.  Avoid picking or squeezing your pimples. That can make your acne worse and cause scarring.  Keep all follow-up visits as told by your doctor. This is important.  Shave gently. Only shave when it is necessary.  Keep a food journal. This can help you to see if any foods are linked with your acne. Contact a  doctor if:  Your acne is not better after eight weeks.  Your acne gets worse.  You have a large area of skin that is red or tender.  You think that you are having side effects from any acne medicine. This information is not intended to replace advice given to you by your health care provider. Make sure you discuss any questions you have with your health care provider. Document Released: 12/03/2011 Document Revised: 05/21/2016 Document Reviewed: 02/20/2015 Elsevier Interactive Patient Education  [employer]2018 Else[employer] Inc[employer]

## 2018-07-20 ENCOUNTER — Encounter: Payer: Self-pay | Admitting: Family Medicine

## 2018-07-24 ENCOUNTER — Encounter: Payer: Self-pay | Admitting: Family Medicine

## 2018-07-24 ENCOUNTER — Other Ambulatory Visit: Payer: Self-pay | Admitting: Family Medicine

## 2018-07-24 MED ORDER — CLINDAMYCIN PHOSPHATE 1 % EX GEL: Freq: Two times a day (BID) | CUTANEOUS | 1 refills | Status: DC

## 2018-07-24 MED ORDER — ADAPALENE 0.1 % EX GEL: Freq: Every day | CUTANEOUS | 1 refills | Status: DC

## 2018-07-24 NOTE — Progress Notes (Signed)
   Kelly BongBrittany R [name]     MRN: [id number]      DOB: 10/16/1991   HPI Ms. Kelly [name] is here for follow up and to establish care. Her only concern is facial acne , which she has had since she wads a teenager. She has used topical agents and antibiotics, and is still unhappy with the outcome , knows about her disease and wants to know if there is something else she can try, as she is still not satisfied. She had called in prior to seeing me for a referral to dermatology , however now although I explain there is little to add sh wants to try my recommendation which I promise to get back to her n, prior to referring Currently on oral contraceptive, wanted to know if I think this is the best for her acne , I advised her to remain on what she is taking , and ascertained that she does use condoms to protect against STD's Uses Clinique products which she has had the mostvsuccess with and used for the longest time Exercises on average 2 to 3 times weeklyy and is making a special effort to increase intake of fruit and vegetables  ROS Denies recent fever or chills. Denies sinus pressure, nasal congestion, ear pain or sore throat. Denies chest congestion, productive cough or wheezing. Denies chest pains, palpitations and leg swelling Denies abdominal pain, nausea, vomiting,diarrhea or constipation.   Denies dysuria, frequency, hesitancy or incontinence. Denies joint pain, swelling and limitation in mobility. Denies headaches, seizures, numbness, or tingling. Denies depression, anxiety or insomnia.  PE  BP 112/80 (BP Location: Left Arm, Patient Position: Sitting, Cuff Size: Normal)   Pulse 99   Ht 5\' 3"  (1.6 m)   Wt 121 lb 1.9 oz (54.9 kg)   SpO2 99%   BMI 21.46 kg/m   Patient alert and oriented and in no cardiopulmonary distress.  HEENT: No facial asymmetry, EOMI,   oropharynx pink and moist.  Neck supple no JVD, no mass.  Chest: Clear to auscultation bilaterally.  CVS: S1, S2 no murmurs, no  S3.Regular rate.  ABD: Soft non tender.   Ext: No edema  MS: Adequate ROM spine, shoulders, hips and knees.  Skin: acne vulgaris of the face, no pustules   Psych: Good eye contact, normal affect. Memory intact not anxious or depressed appearing.  CNS: CN 2-12 intact, power,  normal throughout.no focal deficits noted.   Assessment & Plan  Acne vulgaris Longstanding mild to moderate inflammatory facial acne, recommend topical retinoid,differin, with topical benzaclin and topical antibiotic, pulse dosing of oral antibiotic , short term use only 6 weeks  Max based on current presentation, minocycline preferred pt to use condoms at those times along with her OCP. Will message pt ands await on response

## 2018-07-24 NOTE — Assessment & Plan Note (Addendum)
Longstanding mild to moderate inflammatory facial acne, recommend topical retinoid,differin, with topical benzaclin and topical antibiotic, pulse dosing of oral antibiotic , short term use only 6 weeks  Max based on current presentation, minocycline preferred pt to use condoms at those times along with her OCP. Will message pt ands await on response

## 2018-08-01 ENCOUNTER — Encounter: Payer: Self-pay | Admitting: Family Medicine

## 2018-10-28 ENCOUNTER — Other Ambulatory Visit: Payer: Self-pay | Admitting: Family Medicine

## 2018-12-14 ENCOUNTER — Encounter: Payer: Self-pay | Admitting: Family Medicine

## 2018-12-15 ENCOUNTER — Emergency Department (HOSPITAL_BASED_OUTPATIENT_CLINIC_OR_DEPARTMENT_OTHER)
Admission: EM | Admit: 2018-12-15 | Discharge: 2018-12-15 | Disposition: A | Discharge status: Home / Self Care | Payer: BLUE CROSS/BLUE SHIELD | Attending: Emergency Medicine | Admitting: Emergency Medicine

## 2018-12-15 ENCOUNTER — Telehealth: Payer: Self-pay | Admitting: *Deleted

## 2018-12-15 ENCOUNTER — Emergency Department (HOSPITAL_BASED_OUTPATIENT_CLINIC_OR_DEPARTMENT_OTHER): Payer: BLUE CROSS/BLUE SHIELD

## 2018-12-15 ENCOUNTER — Other Ambulatory Visit: Payer: Self-pay

## 2018-12-15 ENCOUNTER — Encounter (HOSPITAL_BASED_OUTPATIENT_CLINIC_OR_DEPARTMENT_OTHER): Payer: Self-pay

## 2018-12-15 DIAGNOSIS — Z79899 Other long term (current) drug therapy: Secondary | ICD-10-CM | POA: Diagnosis not present

## 2018-12-15 DIAGNOSIS — I8002 Phlebitis and thrombophlebitis of superficial vessels of left lower extremity: Secondary | ICD-10-CM | POA: Diagnosis not present

## 2018-12-15 DIAGNOSIS — M79662 Pain in left lower leg: Secondary | ICD-10-CM | POA: Diagnosis not present

## 2018-12-15 DIAGNOSIS — I82812 Embolism and thrombosis of superficial veins of left lower extremities: Secondary | ICD-10-CM | POA: Diagnosis not present

## 2018-12-15 HISTORY — DX: Dizziness and giddiness: R42

## 2018-12-15 LAB — CBC
HCT: 42.8 % (ref 36.0–46.0)
Hemoglobin: 13.3 g/dL (ref 12.0–15.0)
MCH: 29.8 pg (ref 26.0–34.0)
MCHC: 31.1 g/dL (ref 30.0–36.0)
MCV: 96 fL (ref 80.0–100.0)
Platelets: 360 10*3/uL (ref 150–400)
RBC: 4.46 MIL/uL (ref 3.87–5.11)
RDW: 12 % (ref 11.5–15.5)
WBC: 5 10*3/uL (ref 4.0–10.5)
nRBC: 0 % (ref 0.0–0.2)

## 2018-12-15 LAB — BASIC METABOLIC PANEL
Anion gap: 7 (ref 5–15)
BUN: 8 mg/dL (ref 6–20)
CO2: 23 mmol/L (ref 22–32)
Calcium: 8.9 mg/dL (ref 8.9–10.3)
Chloride: 103 mmol/L (ref 98–111)
Creatinine, Ser: 0.63 mg/dL (ref 0.44–1.00)
GFR calc Af Amer: >60 mL/min (ref >60)
GFR calc non Af Amer: >60 mL/min (ref >60)
Glucose, Bld: 103 mg/dL — ABNORMAL HIGH (ref 70–99)
Potassium: 3.5 mmol/L (ref 3.5–5.1)
Sodium: 133 mmol/L — ABNORMAL LOW (ref 135–145)

## 2018-12-15 LAB — PREGNANCY, URINE: Preg Test, Ur: NEGATIVE

## 2018-12-15 MED ORDER — ASPIRIN EC 325 MG PO TBEC: 325.0000 mg | DELAYED_RELEASE_TABLET | Freq: Every day | ORAL | 0 refills | Status: DC

## 2018-12-15 NOTE — Telephone Encounter (Signed)
Pt was calling back to see what Dr. Lodema HongSimpson recommends as she cant eat she is feeling light headed and has pain in her left calf.

## 2018-12-15 NOTE — ED Provider Notes (Signed)
[facility] EMERGENCY DEPARTMENT Provider Note   CSN: [id number] Arrival date & time: 12/15/18  1657     History   Chief Complaint Chief Complaint  Patient presents with  . Leg Pain    [name] is a 27 y.o. female.  Kelly BongBrittany R Winsor is a 27 y.o. female who is otherwise healthy, presents to the emergency department for evaluation of pain in the left calf.  She reports pain started 3 days ago she denies any trauma or injury to the leg, no activity out of the ordinary.  Pain is a constant dull ache, nothing makes it better or worse, she used some muscle rub with some improvement last night has not tried anything else for her pain.  Noted a small amount of swelling a few days ago, no redness or warmth.  She describes a burning sensation to the pain in her calf.  Pain seemed to improve this morning but came back while she was at work again today, her PCP recommended she present for DVT rule out.  Patient is on combination OCPs, no history of PE or DVT, no recent surgeries or long distance travel, no family history of blood clots.  She is not having any chest pain or shortness of breath.  Reports she felt a little bit lightheaded when pain started again today but this is since improved.  No abdominal pain, nausea or vomiting.     Past Medical History:  Diagnosis Date  . Allergy    penicillin  . Vertigo     Patient Active Problem List   Diagnosis Date Noted  . Encounter for well woman exam with abnormal findings 12/03/2017  . Acne vulgaris 04/20/2017    Past Surgical History:  Procedure Laterality Date  . TONSILLECTOMY     under 12      OB History   No obstetric history on file.      Home Medications    Prior to Admission medications   Medication Sig Start Date End Date Taking? Authorizing Provider  levonorgestrel-ethinyl estradiol (SEASONALE,INTROVALE,JOLESSA) 0.15-0.03 MG tablet Take 1 tablet by mouth daily. 01/28/18  Yes Eustace MooreNelson, Yvonne Sue, MD    adapalene (DIFFERIN) 0.1 % gel Apply topically at bedtime. Apply thin film topically at bedtime. Reduce frequency or discontinue if prolonged or severe irritation occurs. 07/24/18   Kerri PerchesSimpson, Margaret E, MD  clindamycin (CLINDAGEL) 1 % gel APPLY TO AFFECTED AREA TWICE A DAY 10/31/18   Kerri PerchesSimpson, Margaret E, MD  meclizine (ANTIVERT) 25 MG tablet Take 1 tablet (25 mg total) by mouth 3 (three) times daily as needed for dizziness. 06/22/18   Bethel BornGekas, Kelly Marie, PA-C    Family History Family History  Problem Relation Age of Onset  . Hypertension Mother   . Hypertension Father   . Hypertension Sister   . Hypertension Maternal Grandmother   . Hypertension Maternal Grandfather   . Hypertension Paternal Grandmother   . Hypertension Paternal Grandfather     Social History Social History   Tobacco Use  . Smoking status: Never Smoker  . Smokeless tobacco: Never Used  Substance Use Topics  . Alcohol use: No  . Drug use: No     Allergies   Penicillins   Review of Systems Review of Systems  Constitutional: Negative for chills and fever.  Respiratory: Negative for cough and shortness of breath.   Cardiovascular: Positive for leg swelling. Negative for chest pain.  Gastrointestinal: Negative for abdominal pain, nausea and vomiting.  Musculoskeletal: Positive for  myalgias. Negative for arthralgias and joint swelling.  Skin: Negative for color change and rash.  Neurological: Positive for light-headedness. Negative for dizziness, syncope, weakness and numbness.  All other systems reviewed and are negative.    Physical Exam Updated Vital Signs BP (!) 139/104 (BP Location: Left Arm)   Pulse 92   Temp 99.9 F (37.7 C) (Oral)   Resp 18   Ht 5\' 3"  (1.6 m)   Wt 56.5 kg   SpO2 100%   BMI 22.05 kg/m   Physical Exam Vitals signs and nursing note reviewed.  Constitutional:      General: She is not in acute distress.    Appearance: Normal appearance. She is well-developed and normal  weight. She is not diaphoretic.  HENT:     Head: Normocephalic and atraumatic.     Mouth/Throat:     Pharynx: Oropharynx is clear.  Eyes:     General:        Right eye: No discharge.        Left eye: No discharge.  Cardiovascular:     Rate and Rhythm: Normal rate and regular rhythm.     Pulses: Normal pulses.     Heart sounds: Normal heart sounds. No murmur. No friction rub.  Pulmonary:     Effort: Pulmonary effort is normal. No respiratory distress.     Breath sounds: Normal breath sounds.     Comments: Respirations equal and unlabored, patient able to speak in full sentences, lungs clear to auscultation bilaterally Abdominal:     General: Abdomen is flat. Bowel sounds are normal. There is no distension.     Palpations: Abdomen is soft. There is no mass.     Tenderness: There is no abdominal tenderness. There is no guarding.     Comments: Abdomen soft, nondistended, nontender to palpation in all quadrants without guarding or peritoneal signs  Musculoskeletal:     Comments: Tenderness with palpation over the left calf without palpable deformity, no overlying erythema or warmth and no appreciable swelling, 2+ DP and TP pulses, extremities warm and well-perfused, some normal sensation and strength, normal range of motion at the hip knee and ankle without pain  Skin:    General: Skin is warm and dry.     Capillary Refill: Capillary refill takes less than 2 seconds.  Neurological:     Mental Status: She is alert and oriented to person, place, and time. Mental status is at baseline.     Coordination: Coordination normal.  Psychiatric:        Mood and Affect: Mood normal.        Behavior: Behavior normal.      ED Treatments / Results  Labs (all labs ordered are listed, but only abnormal results are displayed) Labs Reviewed  BASIC METABOLIC PANEL - Abnormal; Notable for the following components:      Result Value   Sodium 133 (*)    Glucose, Bld 103 (*)    All other components  within normal limits  PREGNANCY, URINE  CBC    EKG None  Radiology US Venous Img Lower  Left (dvt Study)  Result Date: 12/15/2018 CLINICAL DATA:  Calf pain. EXAM: LEFT LOWER EXTREMITY VENOUS DOPPLER ULTRASOUND TECHNIQUE: Gray-scale sonography with graded compression, as well as color Doppler and duplex ultrasound were performed to evaluate the lower extremity deep venous systems from the level of the common femoral vein and including the common femoral, femoral, profunda femoral, popliteal and calf veins including the posterior tibial, peroneal  and gastrocnemius veins when visible. The superficial great saphenous vein was also interrogated. Spectral Doppler was utilized to evaluate flow at rest and with distal augmentation maneuvers in the common femoral, femoral and popliteal veins. COMPARISON:  None. FINDINGS: Contralateral Common Femoral Vein: Respiratory phasicity is normal and symmetric with the symptomatic side. No evidence of thrombus. Normal compressibility. Common Femoral Vein: No evidence of thrombus. Normal compressibility, respiratory phasicity and response to augmentation. Saphenofemoral Junction: No evidence of thrombus. Normal compressibility and flow on color Doppler imaging. Profunda Femoral Vein: No evidence of thrombus. Normal compressibility and flow on color Doppler imaging. Femoral Vein: No evidence of thrombus. Normal compressibility, respiratory phasicity and response to augmentation. Popliteal Vein: No evidence of thrombus. Normal compressibility, respiratory phasicity and response to augmentation. Calf Veins: No evidence of thrombus. Normal compressibility and flow on color Doppler imaging. Superficial Great Saphenous Vein: No evidence of thrombus. Normal compressibility. Venous Reflux:  None. Other Findings: There is nonocclusive thrombus within a left posterior calf varicosity at the area of the patient's pain. IMPRESSION: 1. No evidence of deep venous thrombosis. 2.  Nonocclusive superficial thrombophlebitis of a left posterior calf varicosity at the site of the patient's pain. Electronically Signed   By: Obie DredgeWilliam T Derry M.D.   On: 12/15/2018 19:42    Procedures Procedures (including critical care time)  Medications Ordered in ED Medications - No data to display   Initial Impression / Assessment and Plan / ED Course  I have reviewed the triage vital signs and the nursing notes.  Pertinent labs & imaging results that were available during my care of the patient were reviewed by me and considered in my medical decision making (see chart for details).  Presents to the emergency department for evaluation of atraumatic left calf pain for the past 3 days, is currently on OCPs, no other risk factors for PE or DVT and no previous history.  Is not having any chest pain or shortness of breath.  Tenderness over the left calf without appreciable swelling, the left lower extremity is neurovascularly intact, warm and well-perfused.  Will get basic labs and DVT study.  Labs overall unremarkable.  DVT study shows no evidence of deep venous thrombosis, does show nonocclusive superficial thrombophlebitis of the left posterior calf varicosity which is the exact site of patient's pain.  Will start patient on once daily aspirin and have her follow-up closely with her primary care doctor.  Return precautions been discussed.  Patient expresses understanding and is in agreement with plan.  Final Clinical Impressions(s) / ED Diagnoses   Final diagnoses:  Thrombophlebitis of superficial veins of left lower extremity    ED Discharge Orders         Ordered    aspirin EC 325 MG tablet  Daily     12/15/18 2006           Legrand RamsFord,  N, PA-C 12/15/18 2009    Jacalyn LefevreHaviland, Julie, MD 12/15/18 2321

## 2018-12-15 NOTE — ED Triage Notes (Signed)
Pt c/o pain to left calf x 3 days-sent from PCP to r/o DVT-NAD-steady gait

## 2018-12-15 NOTE — Telephone Encounter (Signed)
Tried to call patient back no answer, left a message

## 2018-12-15 NOTE — Discharge Instructions (Addendum)
Work-up today is reassuring, ultrasound shows thrombophlebitis of the varicose vein over the back of your left calf which is likely causing your symptoms, please begin taking aspirin once daily and follow-up with your primary care doctor.  Return to the emergency department for worsening pain in the left leg, numbness or tingling in the leg, redness or warmth or any other new or concerning symptoms.

## 2018-12-15 NOTE — Telephone Encounter (Signed)
Spoke with patient, she states that the pain and warm feeling in her calf is back this afternoon also complains of feeling faint, and wants an appointment with Dr. Lodema HongSimpson today. Recommended that she go to the ED for evaluation of pain and warmth in calf

## 2018-12-16 ENCOUNTER — Telehealth: Payer: Self-pay

## 2018-12-16 DIAGNOSIS — I8002 Phlebitis and thrombophlebitis of superficial vessels of left lower extremity: Secondary | ICD-10-CM

## 2018-12-16 MED ORDER — UNABLE TO FIND: 0 refills | Status: DC

## 2018-12-16 NOTE — Telephone Encounter (Signed)
Patients sister called stating patient unable to call but she had a couple of questions from ED visit. She would like to know if she should stop the birth control and if she should wear compression stockings. She was diagnosed with superficial blood clot.

## 2018-12-16 NOTE — Telephone Encounter (Signed)
Yes I do recommend she stop the OCP and wearing compression hose is a good idea, you may sends in script for 15 to 19 mm HG knee high for BOTH  Legs to he rpharmmacy also please  Please advise her re contraception, I recommend condom use and Gyne visit to determine what is best for her  If sister not listed as a contact, pls send pt a message/ let me know , I will send the message

## 2018-12-16 NOTE — Telephone Encounter (Signed)
Spoke with patient and advised of Dr.Simpson's recommendations with verbal understanding. Will [facility] [place] per patient request.

## 2018-12-16 NOTE — Telephone Encounter (Signed)
Order for compression hose [facility]

## 2018-12-19 ENCOUNTER — Encounter: Payer: Self-pay | Admitting: Family Medicine

## 2018-12-26 ENCOUNTER — Encounter: Payer: Self-pay | Admitting: Family Medicine

## 2019-01-19 ENCOUNTER — Ambulatory Visit: Payer: Self-pay | Admitting: Family Medicine

## 2019-01-19 ENCOUNTER — Ambulatory Visit (INDEPENDENT_AMBULATORY_CARE_PROVIDER_SITE_OTHER): Payer: BLUE CROSS/BLUE SHIELD | Admitting: Family Medicine

## 2019-01-19 ENCOUNTER — Encounter: Payer: Self-pay | Admitting: Family Medicine

## 2019-01-19 VITALS — BP 122/74 | HR 100 | Resp 12 | Ht 63.0 in | Wt 122.1 lb

## 2019-01-19 DIAGNOSIS — I809 Phlebitis and thrombophlebitis of unspecified site: Secondary | ICD-10-CM

## 2019-01-19 DIAGNOSIS — L659 Nonscarring hair loss, unspecified: Secondary | ICD-10-CM

## 2019-01-19 DIAGNOSIS — Z3009 Encounter for other general counseling and advice on contraception: Secondary | ICD-10-CM | POA: Diagnosis not present

## 2019-01-19 DIAGNOSIS — L700 Acne vulgaris: Secondary | ICD-10-CM | POA: Diagnosis not present

## 2019-01-19 NOTE — Progress Notes (Signed)
   Kelly [name]     MRN: [id number]      DOB: 1991-07-27   HPI Ms. Kelly [name] is here for follow up and re-evaluation of chronic medical conditions, medication management and review of any available recent lab and radiology data.  Preventive health is updated, specifically  Cancer screening and Immunization.   Ongoing discomfort in left leg , hose improve this , but no  exercise tolerance currently  Approximately  8 month h/o balding, particularly over sides of head. Denies change in hair products aggravating the situation. Has not discussed with family, tearful and anxious about her health, first the alopecia, then the phlebitis. Currently depression and anxiety screen are negative, however, she is obviously EXTREMELY anxious and mildly depressed because of health conditions which are developing  have encouraged her strongly to trust one of her family members and share her distress re the alopecia as we get Professional help Needs Gyne consult re contraceptive options Improvement and happy with acne treatment though she has had to d/c the  OCP  ROS Denies recent fever or chills. Denies sinus pressure, nasal congestion, ear pain or sore throat. Denies chest congestion, productive cough or wheezing. Denies chest pains, palpitations and leg swelling Denies abdominal pain, nausea, vomiting,diarrhea or constipation.   Denies dysuria, frequency, hesitancy or incontinence. Denies joint pain, swelling  Denies headaches, seizures, numbness, or tingling. Denies depression, anxiety or insomnia.    PE  BP 122/74   Pulse 100   Resp 12   Ht 5\' 3"  (1.6 m)   Wt 122 lb 1.9 oz (55.4 kg)   SpO2 96% Comment: room air  BMI 21.63 kg/m   Patient alert and oriented and in no cardiopulmonary distress.  HEENT: No facial asymmetry, EOMI,   oropharynx pink and moist.  Neck supple no JVD, no mass.  Chest: Clear to auscultation bilaterally.  CVS: S1, S2 no murmurs, no S3.Regular rate.  ABD: Soft non  tender.   Ext: No edema  MS: Adequate ROM spine, shoulders, hips and knees.  Skin: Intact, moderate acne, adequately controlled Alopecia noted in right parietal area, diameter approx 20 cm, unable to visualize side due to hair cover  Psych: Good eye contact, normal affect. Memory intact tearful anxious and mildly  depressed appearing.as she exprsses her health fears and concerns   CNS: CN 2-12 intact, power,  normal throughout.no focal deficits noted.   Assessment & Plan  Acne vulgaris Improved an d adequately managed  on current regime, continue same  Alopecia of scalp 8 month h/o hair loss affecting temroparietal areas bilaterally.Diameter of affected area on right visualized is approx 20 cm. Cause of great anxiety and mild depression , needs Derm evaluation and management, refer  Thrombophlebitis Superficial left thrombophlebitis of left leg dx in 11/2018. C/o pain with exercise which she used to do in the past , states after less than 15 mins has to rest. Advised her to remain on full strength aspirin , try to gradually increase exercise routine, if remains symptomatic in next 4 weeks, call  For referral to vascular  Encounter for counseling regarding contraception Pt dx with thrombophlebitis and remains symptomatic despite aspirin 325 mg daily and off OCP, needs Gyne consult  Re most appropriate contraception

## 2019-01-19 NOTE — Patient Instructions (Signed)
F/U   In 6 months, call if you need me before  You are being referred to Dermatology, we will be in touch.    You are being referred to Dr Emelda Fear to discuss best contraceptive options for you  Continue the aspirin

## 2019-01-21 ENCOUNTER — Encounter: Payer: Self-pay | Admitting: Family Medicine

## 2019-01-21 DIAGNOSIS — I809 Phlebitis and thrombophlebitis of unspecified site: Secondary | ICD-10-CM | POA: Insufficient documentation

## 2019-01-21 DIAGNOSIS — L659 Nonscarring hair loss, unspecified: Secondary | ICD-10-CM | POA: Insufficient documentation

## 2019-01-21 DIAGNOSIS — L658 Other specified nonscarring hair loss: Secondary | ICD-10-CM | POA: Insufficient documentation

## 2019-01-21 NOTE — Assessment & Plan Note (Addendum)
Pt dx with thrombophlebitis and remains symptomatic despite aspirin 325 mg daily and off OCP, needs Gyne consult  Re most appropriate contraception

## 2019-01-21 NOTE — Assessment & Plan Note (Addendum)
Improved an d adequately managed  on current regime, continue same

## 2019-01-21 NOTE — Assessment & Plan Note (Signed)
Superficial left thrombophlebitis of left leg dx in 11/2018. C/o pain with exercise which she used to do in the past , states after less than 15 mins has to rest. Advised her to remain on full strength aspirin , try to gradually increase exercise routine, if remains symptomatic in next 4 weeks, call  For referral to vascular

## 2019-01-21 NOTE — Assessment & Plan Note (Signed)
8 month h/o hair loss affecting temroparietal areas bilaterally.Diameter of affected area on right visualized is approx 20 cm. Cause of great anxiety and mild depression , needs Derm evaluation and management, refer

## 2019-02-08 ENCOUNTER — Encounter: Payer: Self-pay | Admitting: Obstetrics and Gynecology

## 2019-02-08 ENCOUNTER — Ambulatory Visit: Payer: BLUE CROSS/BLUE SHIELD | Admitting: Obstetrics and Gynecology

## 2019-02-08 DIAGNOSIS — Z309 Encounter for contraceptive management, unspecified: Secondary | ICD-10-CM | POA: Diagnosis not present

## 2019-02-08 DIAGNOSIS — Z3202 Encounter for pregnancy test, result negative: Secondary | ICD-10-CM

## 2019-02-08 MED ORDER — NORETHINDRONE 0.35 MG PO TABS: 1.0000 | ORAL_TABLET | Freq: Every day | ORAL | 4 refills | Status: DC

## 2019-02-08 NOTE — Progress Notes (Signed)
Patient ID: Kelly [name], female   DOB: 05-19-1991, 28 y.o.   MRN: [id number]   [facility] Clinic Visit  @DATE @            Patient name: Kelly [name] MRN [id number]  Date of birth: 16-May-1991  CC & HPI:  Kelly [name] is a 28 y.o. female presenting today for contraception option. Was referred by PCP dr. Delton [name] and Dr. Syliva [name]. In office pregnancy test was negative. 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 Had superficial phlebitis while taking BC pills and primary told her to stop taking the pills. Maintained taking her pill on time, she had roughly 3 periods on the pill. If she didn't take the pill she would have severe cramping, acne and heavy periods.  ROS:  ROS   All systems are negative except as noted in the HPI and PMH.   Pertinent History Reviewed:   Reviewed:  Medical         Past Medical History:  Diagnosis Date  . Allergy    penicillin  . Vertigo                               Surgical Hx:    Past Surgical History:  Procedure Laterality Date  . TONSILLECTOMY     under 12    Medications: Reviewed & Updated - [name] associated section                       Current Outpatient Medications:  .  adapalene (DIFFERIN) 0.1 % gel, Apply topically at bedtime. Apply thin film topically at bedtime. Reduce frequency or discontinue if prolonged or severe irritation occurs., Disp: 45 g, Rfl: 1 .  aspirin EC 325 MG tablet, Take 1 tablet (325 mg total) by mouth daily., Disp: 30 tablet, Rfl: 0 .  clindamycin (CLINDAGEL) 1 % gel, APPLY TO AFFECTED AREA TWICE A DAY, Disp: 30 g, Rfl: 1 .  UNABLE TO FIND, 1 pair compression hose 15-20 mmHg based on insurance coverage., Disp: 1 each, Rfl: 0 .  meclizine (ANTIVERT) 25 MG tablet, Take 1 tablet (25 mg total) by mouth 3 (three) times daily as needed for dizziness. (Patient not taking: Reported on 02/08/2019), Disp: 30 tablet, Rfl: 0   Social History: Reviewed -  reports that she has never smoked. She has  never used smokeless tobacco.  Objective Findings:  Vitals: Blood pressure 116/70, pulse 86, height 5\' 3"  (1.6 m), weight 123 lb 12.8 oz (56.2 kg), last menstrual period 01/16/2019.  PHYSICAL EXAMINATION General appearance - alert, well appearing, and in no distress, normal appearing weight Mental status - alert, oriented to person, place, and time, normal mood, behavior, speech, dress, motor activity, and thought processes, affect appropriate to mood  PELVIC NOT DONE  Discussion: 1. Discussed with pt risks and benefits of contraception options : Nexplanon, Micronoir & IUD  At end of discussion, pt had opportunity to ask questions and has no further questions at this time. With phlebitis staying away from estrogen only pills. Does not want Nexplanon implant  Specific discussion of contraception management as noted above. Greater than 50% was spent in counseling and coordination of care with the patient.   Total time greater than: 15 minutes.   Assessment & Plan:   A:  1.  Contraception management  P:  1. Rx MicronoR 2. 1 year gyn f/u  By signing my name below, I, Arnette Norris, attest that this documentation has been prepared under the direction and in the presence of Tilda Burrow, MD. Electronically Signed: Arnette Norris Medical Scribe. 02/08/19. 4:19 PM.  I personally performed the services described in this documentation, which was SCRIBED in my presence. The recorded information has been reviewed and considered accurate. It has been edited as necessary during review. Tilda Burrow, MD

## 2019-02-10 LAB — POCT URINE PREGNANCY: Preg Test, Ur: NEGATIVE

## 2019-02-11 ENCOUNTER — Encounter: Payer: Self-pay | Admitting: Family Medicine

## 2019-04-07 ENCOUNTER — Encounter: Payer: Self-pay | Admitting: Family Medicine

## 2019-04-07 ENCOUNTER — Telehealth: Payer: BLUE CROSS/BLUE SHIELD | Admitting: Nurse Practitioner

## 2019-04-07 DIAGNOSIS — H60332 Swimmer's ear, left ear: Secondary | ICD-10-CM

## 2019-04-07 MED ORDER — CIPROFLOXACIN-DEXAMETHASONE 0.3-0.1 % OT SUSP: 4.0000 [drp] | Freq: Two times a day (BID) | OTIC | 0 refills | Status: DC

## 2019-04-07 MED ORDER — OFLOXACIN 0.3 % OT SOLN: 5.0000 [drp] | Freq: Every day | OTIC | 0 refills | Status: DC

## 2019-04-07 NOTE — Addendum Note (Signed)
Addended by: Bennie Pierini on: 04/07/2019 05:21 PM   Modules accepted: Orders

## 2019-04-07 NOTE — Progress Notes (Signed)
Changed rx to ofloxacin Orders Placed This Encounter     DISCONTD: ciprofloxacin-dexamethasone (CIPRODEX) OTIC suspension         Sig: Place 4 drops into the left ear 2 (two) times daily.         Dispense:  7.5 mL         Refill:  0         Order Specific Question: Supervising Provider         Answer: MILLER, BRIAN [[id number]]     ofloxacin (FLOXIN OTIC) 0.3 % OTIC solution         Sig: Place 5 drops into both ears daily.         Dispense:  10 mL         Refill:  0         Order Comments: Cancel ciprodex rx previously snet in         Order Specific Question: Supervising Provider         Answer: Eber Hong [[id number]]

## 2019-04-07 NOTE — Progress Notes (Signed)
E Visit for Swimmer's Ear  We are sorry that you are not feeling well. Here is how we plan to help!  I have prescribed: Ciprofloxin 0.2% and hydrocortisone 1% otic suspension 3 drops in affected ears twice daily for 7 days    In certain cases swimmer's ear may progress to a more serious bacterial infection of the middle or inner ear.  If you have a fever 102 and up and significantly worsening symptoms, this could indicate a more serious infection moving to the middle/inner and needs face to face evaluation in an office by a provider.  Your symptoms should improve over the next 3 days and should resolve in about 7 days.  HOME CARE:   Wash your hands frequently.  Do not place the tip of the bottle on your ear or touch it with your fingers.  You can take Acetominophen 650 mg every 4-6 hours as needed for pain.  If pain is severe or moderate, you can apply a heating pad (set on low) or hot water bottle (wrapped in a towel) to outer ear for 20 minutes.  This will also increase drainage.  Avoid ear plugs  Do not use Q-tips  After showers, help the water run out by tilting your head to one side.  GET HELP RIGHT AWAY IF:   Fever is over 102.2 degrees.  You develop progressive ear pain or hearing loss.  Ear symptoms persist longer than 3 days after treatment.  MAKE SURE YOU:   Understand these instructions.  Will watch your condition.  Will get help right away if you are not doing well or get worse.  TO PREVENT SWIMMER'S EAR:  Use a bathing cap or custom fitted swim molds to keep your ears dry.  Towel off after swimming to dry your ears.  Tilt your head or pull your earlobes to allow the water to escape your ear canal.  If there is still water in your ears, consider using a hairdryer on the lowest setting.  Thank you for choosing an e-visit. Your e-visit answers were reviewed by a board certified advanced clinical practitioner to complete your personal care plan.  Depending upon the condition, your plan could have included both over the counter or prescription medications. Please review your pharmacy choice. Be sure that the pharmacy you have chosen is open so that you can pick up your prescription now.  If there is a problem you may message your provider in MyChart to have the prescription routed to another pharmacy. Your safety is important to us. If you have drug allergies check your prescription carefully.  For the next 24 hours, you can use MyChart to ask questions about today's visit, request a non-urgent call back, or ask for a work or school excuse from your e-visit provider. You will get an email in the next two days asking about your experience. I hope that your e-visit has been valuable and will speed your recovery.   5 minutes spent reviewing and documenting in chart.     

## 2019-05-17 ENCOUNTER — Other Ambulatory Visit: Payer: Self-pay | Admitting: Family Medicine

## 2019-05-18 ENCOUNTER — Other Ambulatory Visit: Payer: Self-pay | Admitting: Family Medicine

## 2019-05-18 ENCOUNTER — Encounter: Payer: Self-pay | Admitting: Family Medicine

## 2019-05-18 DIAGNOSIS — I803 Phlebitis and thrombophlebitis of lower extremities, unspecified: Secondary | ICD-10-CM

## 2019-05-18 NOTE — Progress Notes (Signed)
amb vasc

## 2019-06-08 ENCOUNTER — Encounter: Payer: Self-pay | Admitting: Family Medicine

## 2019-06-08 DIAGNOSIS — L658 Other specified nonscarring hair loss: Secondary | ICD-10-CM | POA: Diagnosis not present

## 2019-06-08 DIAGNOSIS — L700 Acne vulgaris: Secondary | ICD-10-CM | POA: Diagnosis not present

## 2019-06-28 ENCOUNTER — Encounter: Payer: Self-pay | Admitting: Family Medicine

## 2019-06-28 IMAGING — US US BREAST*L* LIMITED INC AXILLA
1 series · 3 of 3 positions shown · non-contrast
Comparison: None

CLINICAL DATA: 26-year-old patient presents for evaluation of the
left breast. She states that a possible lump was palpated in the
inferior left breast on clinical physical examination. The patient
does not definitely palpate a lump. She has no concerns regarding
the right breast.

EXAM:
ULTRASOUND OF THE LEFT BREAST

[Series 1: us breast*left* limited inc axilla · 0.09mm/px · 3 of 3 slices shown]
[im 1/3]
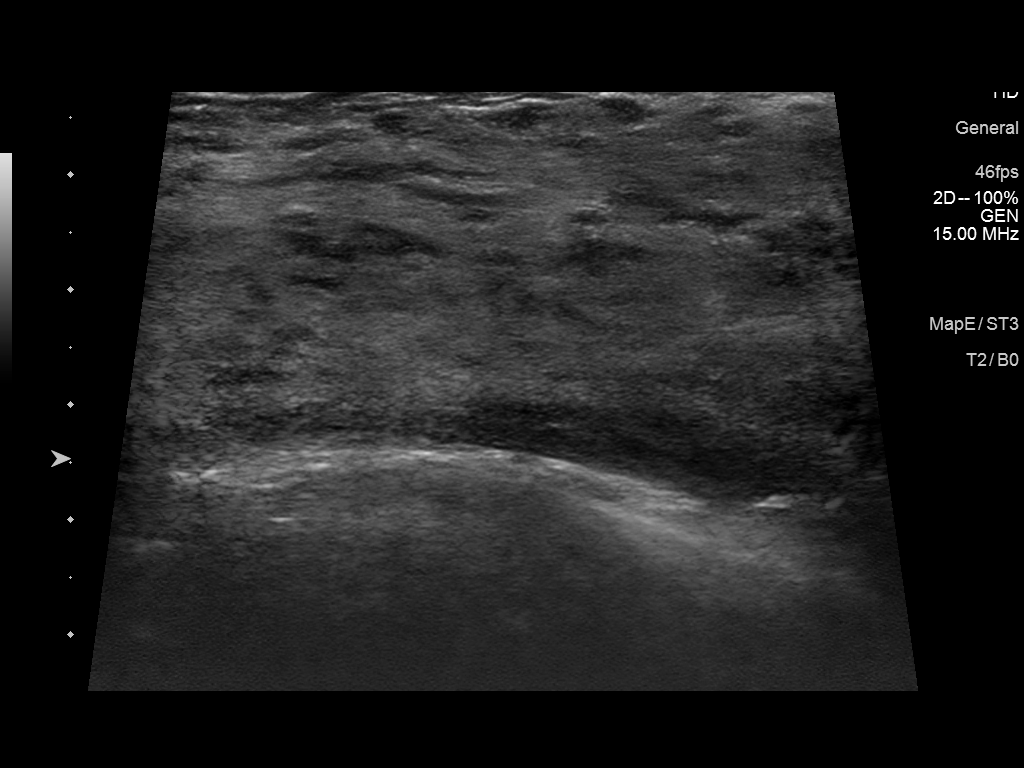
[im 2/3]
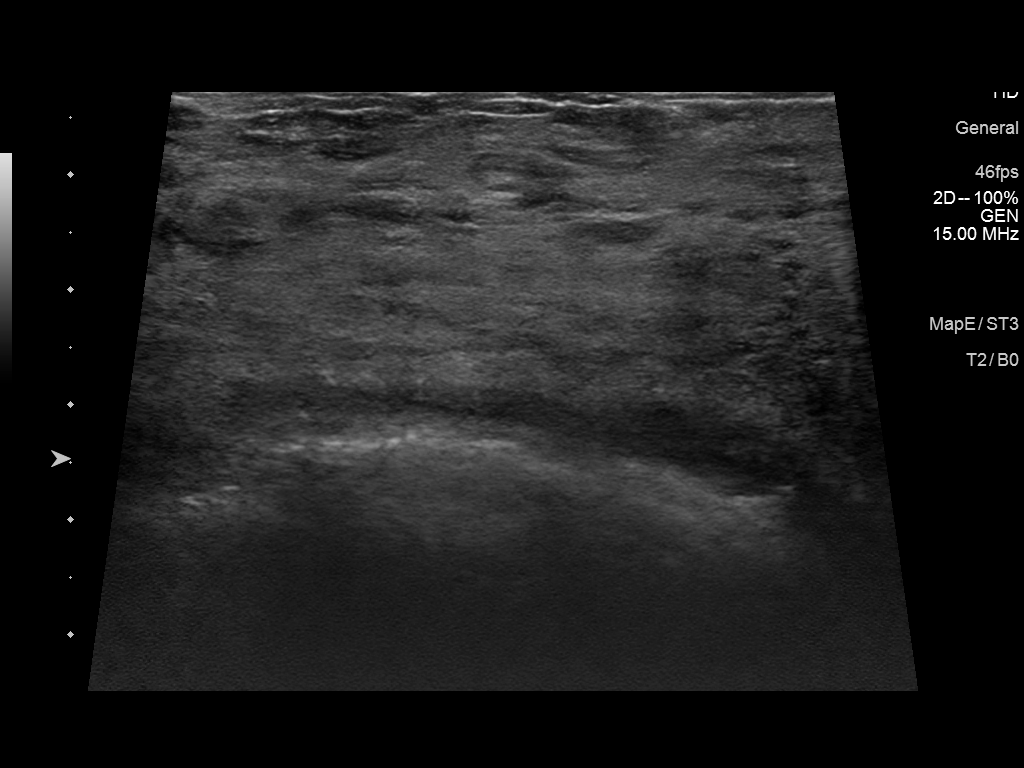
[im 3/3]
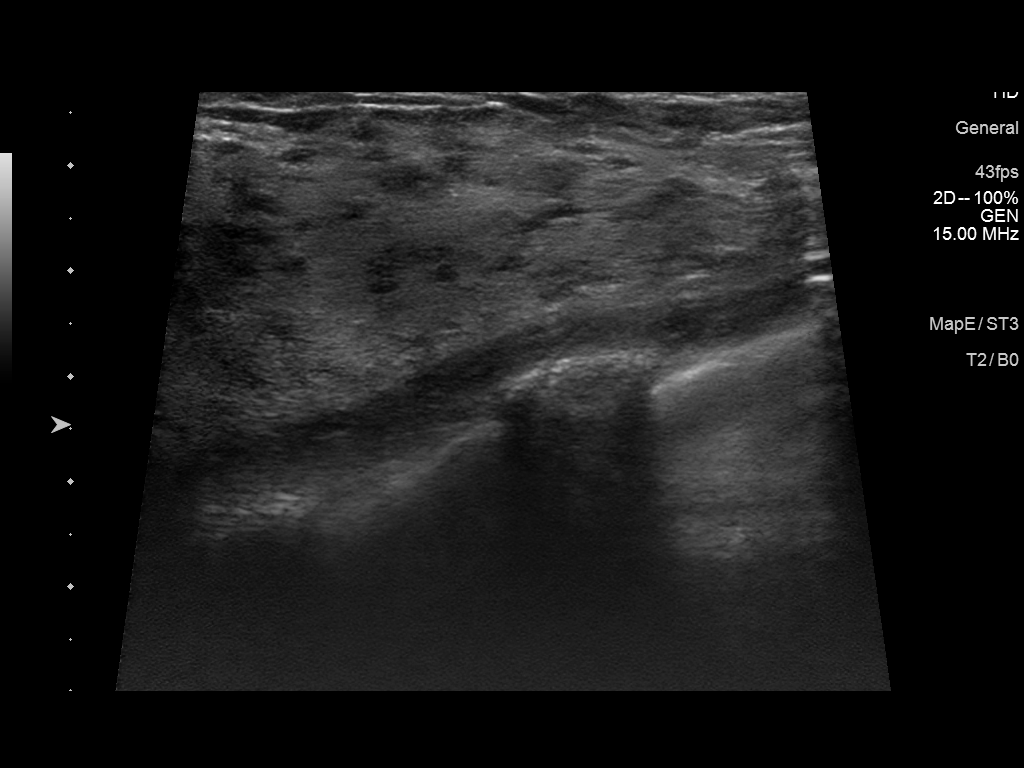

[3 of 3 positions shown; findings below may reference images not displayed]

FINDINGS: On physical exam, I do not palpate may discrete mass in the inferior
left breast. I palpate diffuse soft nodularity that feels like dense
breast parenchyma.

Targeted ultrasound is performed, showing normal dense breast
parenchyma. No solid or cystic mass or abnormal shadowing is
identified in the inferior left breast.
IMPRESSION: No sonographic evidence of malignancy in the left breast.

RECOMMENDATION:
Screening mammogram at age 42 unless there are persistent or
intervening clinical concerns. (Code:95-E-LJC)

I have discussed the findings and recommendations with the patient.
Results were also provided in writing at the conclusion of the
visit. If applicable, a reminder letter will be sent to the patient
regarding the next appointment.

BI-RADS CATEGORY  1: Negative.

## 2019-07-06 ENCOUNTER — Other Ambulatory Visit: Payer: Self-pay

## 2019-07-06 ENCOUNTER — Telehealth (HOSPITAL_COMMUNITY): Payer: Self-pay

## 2019-07-06 DIAGNOSIS — I809 Phlebitis and thrombophlebitis of unspecified site: Secondary | ICD-10-CM

## 2019-07-06 NOTE — Telephone Encounter (Signed)
The above patient or their representative was contacted and gave the following answers to these questions:         Do you have any of the following symptoms? NO  Fever                    Cough                   Shortness of breath  Do  you have any of the following other symptoms?    muscle pain         vomiting,        diarrhea        rash         weakness        red eye        abdominal pain         bruising          bruising or bleeding              joint pain           severe headache    Have you been in contact with someone who was or has been sick in the past 2 weeks? No  Yes                 Unsure                         Unable to assess   Does the person that you were in contact with have any of the following symptoms?   Cough         shortness of breath           muscle pain         vomiting,            diarrhea            rash            weakness           fever            red eye           abdominal pain           bruising  or  bleeding                joint pain                severe headache               Have you  or someone you have been in contact with traveled internationally in th last month?       No  If yes, which countries?   Have you  or someone you have been in contact with traveled outside [place] in th last month?  No        If yes, which state and city?   COMMENTS OR ACTION PLAN FOR THIS PATIENT:          

## 2019-07-07 ENCOUNTER — Ambulatory Visit (HOSPITAL_COMMUNITY)
Admission: RE | Admit: 2019-07-07 | Discharge: 2019-07-07 | Disposition: A | Discharge status: Home / Self Care | Payer: BC Managed Care – PPO | Source: Ambulatory Visit | Attending: Family | Admitting: Family

## 2019-07-07 ENCOUNTER — Ambulatory Visit (INDEPENDENT_AMBULATORY_CARE_PROVIDER_SITE_OTHER): Payer: BC Managed Care – PPO | Admitting: Vascular Surgery

## 2019-07-07 ENCOUNTER — Other Ambulatory Visit: Payer: Self-pay

## 2019-07-07 ENCOUNTER — Encounter: Payer: Self-pay | Admitting: Vascular Surgery

## 2019-07-07 VITALS — BP 103/66 | HR 89 | Temp 97.5°F | Resp 20 | Ht 63.0 in | Wt 124.0 lb

## 2019-07-07 DIAGNOSIS — I809 Phlebitis and thrombophlebitis of unspecified site: Secondary | ICD-10-CM | POA: Insufficient documentation

## 2019-07-07 DIAGNOSIS — I80252 Phlebitis and thrombophlebitis of left calf muscular vein: Secondary | ICD-10-CM

## 2019-07-07 NOTE — Progress Notes (Signed)
Patient ID: Kelly [name], female   DOB: December 17, 1991, 28 y.o.   MRN: [id number]  Reason for Consult: New Patient (Initial Visit)   Referred by Kerri PerchesSimpson, Margaret E, MD  Subjective:     HPI:  Kelly BongBrittany R [name] is a 28 y.o. female with a history of thrombophlebitis of a varicosity of the left posterior calf last December.  She has never had a history of DVT has no family history of DVT.  At the time she was taking an estrogen-based hormonal therapy has now switched to progesterone-based OCPs.  Has not had any further issues.  Currently does not have any pain in the left leg.  Has left lower extremity duplex today prior to this visit.  Past Medical History:  Diagnosis Date  . Allergy    penicillin  . Vertigo    Family History  Problem Relation Age of Onset  . Hypertension Mother   . Hypertension Father   . Hypertension Sister   . Hypertension Maternal Grandmother   . Hypertension Maternal Grandfather   . Hypertension Paternal Grandmother   . Hypertension Paternal Grandfather    Past Surgical History:  Procedure Laterality Date  . TONSILLECTOMY     under 12     Short Social History:  Social History   Tobacco Use  . Smoking status: Never Smoker  . Smokeless tobacco: Never Used  Substance Use Topics  . Alcohol use: No    Allergies  Allergen Reactions  . Penicillins Rash    Current Outpatient Medications  Medication Sig Dispense Refill  . adapalene (DIFFERIN) 0.1 % gel Apply topically at bedtime. Apply thin film topically at bedtime. Reduce frequency or discontinue if prolonged or severe irritation occurs. 45 g 1  . aspirin EC 325 MG tablet Take 1 tablet (325 mg total) by mouth daily. 30 tablet 0  . clindamycin (CLINDAGEL) 1 % gel APPLY TO AFFECTED AREA TWICE A DAY 30 g 1  . meclizine (ANTIVERT) 25 MG tablet Take 1 tablet (25 mg total) by mouth 3 (three) times daily as needed for dizziness. 30 tablet 0  . norethindrone (MICRONOR,CAMILA,ERRIN) 0.35 MG tablet Take 1  tablet (0.35 mg total) by mouth daily. 3 Package 4  . spironolactone (ALDACTONE) 50 MG tablet Take 50 mg by mouth daily.    Marland Kitchen. UNABLE TO FIND 1 pair compression hose 15-20 mmHg based on insurance coverage. 1 each 0   No current facility-administered medications for this visit.     Review of Systems  Constitutional:  Constitutional negative. HENT: HENT negative.  Eyes: Eyes negative.  Respiratory: Respiratory negative.  Cardiovascular: Cardiovascular negative.  GI: Gastrointestinal negative.  Musculoskeletal: Musculoskeletal negative.  Skin: Skin negative.  Neurological: Neurological negative. Hematologic: Hematologic/lymphatic negative.  Psychiatric: Psychiatric negative.        Objective:  Objective   Vitals:   07/07/19 1344  BP: 103/66  Pulse: 89  Resp: 20  Temp: (!) 97.5 F (36.4 C)  SpO2: 99%  Weight: 124 lb (56.2 kg)  Height: 5\' 3"  (1.6 m)   Body mass index is 21.97 kg/m.  Physical Exam HENT:     Head: Normocephalic.  Eyes:     Pupils: Pupils are equal, round, and reactive to light.  Neck:     Musculoskeletal: Neck supple.  Cardiovascular:     Rate and Rhythm: Normal rate and regular rhythm.     Pulses:          Radial pulses are 2+ on the right side and 2+ on  the left side.       Popliteal pulses are 2+ on the right side and 2+ on the left side.  Skin:    General: Skin is warm.     Capillary Refill: Capillary refill takes less than 2 seconds.  Neurological:     General: No focal deficit present.     Mental Status: She is alert.  Psychiatric:        Mood and Affect: Mood normal.        Behavior: Behavior normal.        Thought Content: Thought content normal.        Judgment: Judgment normal.     Data: I have independently interpreted her left lower extremity venous reflux study which demonstrates saphenofemoral junctions 0.69 cm on the left no reflux is noted in the left lower extremity     Assessment/Plan:     28 year old female with  history of superficial thrombophlebitis of the varicosity of the left posterior calf.  There is no evidence of this by physical exam she does not have any pain at this time.  Oral contraceptives have been switched hopefully this was the cause.  We will discussed with her that I do not think she is at any increased risk of DVT in the future but should take standard precautions when traveling long distances and should have routine cancer screenings when she is of age.  She demonstrates good understanding and can follow-up on an as-needed basis.     Waynetta Sandy MD [facility]

## 2019-07-20 ENCOUNTER — Other Ambulatory Visit: Payer: Self-pay

## 2019-07-20 ENCOUNTER — Encounter: Payer: Self-pay | Admitting: Family Medicine

## 2019-07-20 ENCOUNTER — Ambulatory Visit: Payer: BC Managed Care – PPO | Admitting: Family Medicine

## 2019-07-20 VITALS — BP 114/80 | HR 83 | Temp 98.4°F | Resp 15 | Ht 63.0 in | Wt 125.0 lb

## 2019-07-20 DIAGNOSIS — L659 Nonscarring hair loss, unspecified: Secondary | ICD-10-CM

## 2019-07-20 DIAGNOSIS — I809 Phlebitis and thrombophlebitis of unspecified site: Secondary | ICD-10-CM

## 2019-07-20 DIAGNOSIS — Z113 Encounter for screening for infections with a predominantly sexual mode of transmission: Secondary | ICD-10-CM | POA: Diagnosis not present

## 2019-07-20 DIAGNOSIS — Z3009 Encounter for other general counseling and advice on contraception: Secondary | ICD-10-CM

## 2019-07-20 DIAGNOSIS — Z13220 Encounter for screening for lipoid disorders: Secondary | ICD-10-CM | POA: Diagnosis not present

## 2019-07-20 DIAGNOSIS — L700 Acne vulgaris: Secondary | ICD-10-CM

## 2019-07-20 NOTE — Patient Instructions (Addendum)
F/U in 6 months, call if you need me before  Please call and come for flu vaccine in September   Thankful health needs are being addressed and that you feel better  I do recommend starting the clindamycin on a small area of your trunk where you have acne and see the response before extensive use   It is important that you exercise regularly at least 30 minutes 5 times a week. If you develop chest pain, have severe difficulty breathing, or feel very tired, stop exercising immediately and seek medical attention    Please get fasting CBC, lipid, cmp and EGFr , TSH and vit D and HIV  last week In December

## 2019-07-29 ENCOUNTER — Encounter: Payer: Self-pay | Admitting: Family Medicine

## 2019-07-29 NOTE — Assessment & Plan Note (Signed)
treated by dermatology with daily spironolactone

## 2019-07-29 NOTE — Assessment & Plan Note (Signed)
Currently affecting trunk as well as face , improving, advised cautious use of topical agent on small patches of trunk

## 2019-07-29 NOTE — Assessment & Plan Note (Addendum)
counseled re need for rhythm method of contraception and continued condom use.

## 2019-07-29 NOTE — Progress Notes (Signed)
   Kelly [name]     MRN: [id number]      DOB: [birth year]   [name] is here for follow up and re-evaluation of chronic medical conditions, medication management and review of any available recent lab and radiology data.  Preventive health is updated, specifically  Cancer screening and Immunization.   Questions or concerns regarding consultations or procedures which the PT has had in the interim are  Addressed.Doing well with medication for allopecia ,no adverse s/e and reports some hair regrowth States that her acne is also improving Want help with contraceptive management  ROS Denies recent fever or chills. Denies sinus pressure, nasal congestion, ear pain or sore throat. Denies chest congestion, productive cough or wheezing. Denies chest pains, palpitations and leg swelling Denies abdominal pain, nausea, vomiting,diarrhea or constipation.   Denies dysuria, frequency, hesitancy or incontinence. Denies joint pain, swelling and limitation in mobility. Denies headaches, seizures, numbness, or tingling. Denies depression, anxiety or insomnia. Marland Kitchen   PE  BP 114/80   Pulse 83   Temp 98.4 F (36.9 C) (Temporal)   Resp 15   Ht 5\' 3"  (1.6 m)   Wt 125 lb (56.7 kg)   SpO2 100%   BMI 22.14 kg/m   Patient alert and oriented and in no cardiopulmonary distress.  HEENT: No facial asymmetry, EOMI,   oropharynx pink and moist.  Neck supple no JVD, no mass.  Chest: Clear to auscultation bilaterally.  CVS: S1, S2 no murmurs, no S3.Regular rate.  ABD: Soft non tender.   Ext: No edema  MS: Adequate ROM spine, shoulders, hips and knees.  Skin: Intact, acne improved, scalp not examined, wearing hairpiece.  Psych: Good eye contact, normal affect. Memory intact not anxious or depressed appearing.  CNS: CN 2-12 intact, power,  normal throughout.no focal deficits noted.   Assessment & Plan  Encounter for counseling regarding contraception counseled re need for rhythm method of  contraception and continued condom use.   Thrombophlebitis Had vascular evaluation, aspirin is discontinued and pt reassured that she has no significant venous pathology in lower extremities  Alopecia of scalp treated by dermatology with daily spironolactone   Acne vulgaris Currently affecting trunk as well as face , improving, advised cautious use of topical agent on small patches of trunk

## 2019-07-29 NOTE — Assessment & Plan Note (Signed)
Had vascular evaluation, aspirin is discontinued and pt reassured that she has no significant venous pathology in lower extremities

## 2019-10-05 ENCOUNTER — Telehealth: Payer: Self-pay | Admitting: *Deleted

## 2019-10-05 NOTE — Telephone Encounter (Signed)
Erroneous encounter

## 2019-10-22 ENCOUNTER — Other Ambulatory Visit: Payer: Self-pay | Admitting: Family Medicine

## 2019-12-07 ENCOUNTER — Ambulatory Visit: Payer: BC Managed Care – PPO | Admitting: Family Medicine

## 2019-12-14 DIAGNOSIS — Z79899 Other long term (current) drug therapy: Secondary | ICD-10-CM | POA: Diagnosis not present

## 2019-12-14 DIAGNOSIS — L658 Other specified nonscarring hair loss: Secondary | ICD-10-CM | POA: Diagnosis not present

## 2019-12-14 DIAGNOSIS — L700 Acne vulgaris: Secondary | ICD-10-CM | POA: Diagnosis not present

## 2019-12-14 DIAGNOSIS — L089 Local infection of the skin and subcutaneous tissue, unspecified: Secondary | ICD-10-CM | POA: Diagnosis not present

## 2019-12-29 ENCOUNTER — Other Ambulatory Visit: Payer: Self-pay

## 2019-12-29 ENCOUNTER — Encounter (HOSPITAL_BASED_OUTPATIENT_CLINIC_OR_DEPARTMENT_OTHER): Payer: Self-pay

## 2019-12-29 ENCOUNTER — Emergency Department (HOSPITAL_BASED_OUTPATIENT_CLINIC_OR_DEPARTMENT_OTHER)
Admission: EM | Admit: 2019-12-29 | Discharge: 2019-12-29 | Disposition: A | Discharge status: Home / Self Care | Payer: BC Managed Care – PPO | Attending: Emergency Medicine | Admitting: Emergency Medicine

## 2019-12-29 DIAGNOSIS — Z79899 Other long term (current) drug therapy: Secondary | ICD-10-CM | POA: Diagnosis not present

## 2019-12-29 DIAGNOSIS — R519 Headache, unspecified: Secondary | ICD-10-CM

## 2019-12-29 DIAGNOSIS — U071 COVID-19: Secondary | ICD-10-CM | POA: Insufficient documentation

## 2019-12-29 DIAGNOSIS — Z20822 Contact with and (suspected) exposure to covid-19: Secondary | ICD-10-CM

## 2019-12-29 MED ORDER — KETOROLAC TROMETHAMINE 60 MG/2ML IM SOLN
60.0000 mg | Freq: Once | INTRAMUSCULAR | Status: AC
  Administered 2019-12-29: 23:00:00 60 mg via INTRAMUSCULAR
  Filled 2019-12-29: qty 2

## 2019-12-29 NOTE — ED Triage Notes (Signed)
Pt c/o HA, nasal congestion, loss of taste and smell x today-NAD-steady gait

## 2019-12-29 NOTE — ED Provider Notes (Signed)
[facility] EMERGENCY DEPARTMENT Provider Note   CSN: [id number] Arrival date & time: 12/29/19  1928     History Chief Complaint  Patient presents with  . Headache    Kelly [name] is a 29 y.o. female.  HPI      Woke up with headache, nasal congestion, loss of taste and smell today No cough, no shortness of breath No nausea or vomiting No fevers or chills No body aches Appetite is low No falls/trauma, numbness, weakness facial droop  No prior HA history, headache 8/10, tried tylenol  Past Medical History:  Diagnosis Date  . Allergy    penicillin  . Vertigo     Patient Active Problem List   Diagnosis Date Noted  . Alopecia of scalp 01/21/2019  . Encounter for counseling regarding contraception 12/03/2017  . Acne vulgaris 04/20/2017    Past Surgical History:  Procedure Laterality Date  . TONSILLECTOMY     under 12      OB History   No obstetric history on file.     Family History  Problem Relation Age of Onset  . Hypertension Mother   . Hypertension Father   . Hypertension Sister   . Hypertension Maternal Grandmother   . Hypertension Maternal Grandfather   . Hypertension Paternal Grandmother   . Hypertension Paternal Grandfather     Social History   Tobacco Use  . Smoking status: Never Smoker  . Smokeless tobacco: Never Used  Substance Use Topics  . Alcohol use: No  . Drug use: No    Home Medications Prior to Admission medications   Medication Sig Start Date End Date Taking? Authorizing Provider  adapalene (DIFFERIN) 0.1 % gel Apply topically at bedtime. Apply thin film topically at bedtime. Reduce frequency or discontinue if prolonged or severe irritation occurs. 07/24/18   Kerri Perches, MD  clindamycin (CLINDAGEL) 1 % gel APPLY TO AFFECTED AREA TWICE A DAY 10/23/19   Kerri Perches, MD  spironolactone (ALDACTONE) 50 MG tablet Take 50 mg by mouth daily. 06/08/19   [provider]  UNABLE TO FIND 1 pair  compression hose 15-20 mmHg based on insurance coverage. 12/16/18   Kerri Perches, MD    Allergies    Penicillins  Review of Systems   Review of Systems  Constitutional: Negative for fever.  HENT: Positive for congestion. Negative for sore throat.   Eyes: Negative for visual disturbance.  Respiratory: Negative for cough and shortness of breath.   Cardiovascular: Negative for chest pain.  Gastrointestinal: Negative for abdominal pain, diarrhea, nausea and vomiting.  Genitourinary: Negative for difficulty urinating.  Musculoskeletal: Negative for back pain and neck pain.  Skin: Negative for rash.  Neurological: Positive for headaches. Negative for syncope.    Physical Exam Updated Vital Signs BP 123/86 (BP Location: Right Arm)   Pulse 83   Temp 98 F (36.7 C) (Oral)   Resp 18   Ht 5\' 3"  (1.6 m)   Wt 54.4 kg   LMP 12/24/2019   SpO2 100%   BMI 21.26 kg/m   Physical Exam Vitals and nursing note reviewed.  Constitutional:      General: She is not in acute distress.    Appearance: She is well-developed. She is not diaphoretic.  HENT:     Head: Normocephalic and atraumatic.  Eyes:     Conjunctiva/sclera: Conjunctivae normal.  Cardiovascular:     Rate and Rhythm: Normal rate and regular rhythm.     Heart sounds: Normal heart  sounds. No murmur. No friction rub. No gallop.   Pulmonary:     Effort: Pulmonary effort is normal. No respiratory distress.     Breath sounds: Normal breath sounds. No wheezing or rales.  Abdominal:     General: There is no distension.     Palpations: Abdomen is soft.     Tenderness: There is no abdominal tenderness. There is no guarding.  Musculoskeletal:        General: No tenderness.     Cervical back: Normal range of motion.  Skin:    General: Skin is warm and dry.     Findings: No erythema or rash.  Neurological:     Mental Status: She is alert and oriented to person, place, and time.     GCS: GCS eye subscore is 4. GCS verbal  subscore is 5. GCS motor subscore is 6.     Cranial Nerves: No cranial nerve deficit, dysarthria or facial asymmetry.     Sensory: Sensation is intact. No sensory deficit.     Motor: Motor function is intact.     ED Results / Procedures / Treatments   Labs (all labs ordered are listed, but only abnormal results are displayed) Labs Reviewed  SARS CORONAVIRUS 2 (TAT 6-24 HRS) - Abnormal; Notable for the following components:      Result Value   SARS Coronavirus 2 POSITIVE (*)    All other components within normal limits    EKG None  Radiology No results found.  Procedures Procedures (including critical care time)  Medications Ordered in ED Medications  ketorolac (TORADOL) injection 60 mg (60 mg Intramuscular Given 12/29/19 2240)    ED Course  I have reviewed the triage vital signs and the nursing notes.  Pertinent labs & imaging results that were available during my care of the patient were reviewed by me and considered in my medical decision making (see chart for details).    MDM Rules/Calculators/A&P                      29yo female presents with concern for headache, congestion and loss of taste and smell.   Headache began slowly, no trauma, no fevers, and normal neurologic exam and have low suspicion for [facility], SDH or meningitis.  History consistent with COVID 19 infection. Given toradol. Recommend quarantine, supportive care and discussed reasons to return.  Patient discharged in stable condition with understanding of reasons to return.     Kelly [name] was evaluated in Emergency Department on 12/31/2019 for the symptoms described in the history of present illness. She was evaluated in the context of the global COVID-19 pandemic, which necessitated consideration that the patient might be at risk for infection with the SARS-CoV-2 virus that causes COVID-19. Institutional protocols and algorithms that pertain to the evaluation of patients at risk for COVID-19 are in a state  of rapid change based on information released by regulatory bodies including the CDC and federal and state organizations. These policies and algorithms were followed during the patient's care in the ED.  Final Clinical Impression(s) / ED Diagnoses Final diagnoses:  Acute nonintractable headache, unspecified headache type  Suspected COVID-19 virus infection    Rx / DC Orders ED Discharge Orders    None       Gareth Morgan, MD 12/31/19 2033

## 2019-12-30 LAB — SARS CORONAVIRUS 2 (TAT 6-24 HRS): SARS Coronavirus 2: POSITIVE — AB

## 2020-01-01 ENCOUNTER — Telehealth (HOSPITAL_COMMUNITY): Payer: Self-pay

## 2020-01-04 ENCOUNTER — Ambulatory Visit (INDEPENDENT_AMBULATORY_CARE_PROVIDER_SITE_OTHER): Payer: BC Managed Care – PPO | Admitting: Family Medicine

## 2020-01-04 ENCOUNTER — Other Ambulatory Visit: Payer: Self-pay

## 2020-01-04 ENCOUNTER — Encounter: Payer: Self-pay | Admitting: Family Medicine

## 2020-01-04 VITALS — BP 123/86 | Ht 63.0 in | Wt 120.0 lb

## 2020-01-04 DIAGNOSIS — U071 COVID-19: Secondary | ICD-10-CM

## 2020-01-04 DIAGNOSIS — I80252 Phlebitis and thrombophlebitis of left calf muscular vein: Secondary | ICD-10-CM | POA: Insufficient documentation

## 2020-01-04 MED ORDER — PREDNISONE 10 MG PO TABS: 10.0000 mg | ORAL_TABLET | Freq: Two times a day (BID) | ORAL | 0 refills | Status: DC

## 2020-01-04 NOTE — Progress Notes (Signed)
Virtual Visit via Telephone Note  I connected with Kelly [name] on 01/04/20 at  4:00 PM EST by telephone and verified that I am speaking with the correct person using two identifiers.  Location: Patient: home and working from home Provider: office   I discussed the limitations, risks, security and privacy concerns of performing an evaluation and management service by telephone and the availability of in person appointments. I also discussed with the patient that there may be a patient responsible charge related to this service. The patient expressed understanding and agreed to proceed.   History of Present Illness: F/u from recent ED visit on 12/29/2019, when she presented with Neurologic symptoms, headache , loss of taste and smell and was diagnosed with Covid. Headache resolved wih treatment in the ED, taste and smell have still not returned. Dry cough started yesterday She has worked all f this week from home, but requests tomorrow off to try to feel better as far as general energy level is concerned, which is appropriate. Has question re re testing . I advise this is not recommended, and certainly not before  12 weeks after diagnosis Prior to this has been well ,and will keep upcoming appointment at end of the month. She will participate in daily temperature checks, has been doing this and has no fever   Observations/Objective: BP 123/86   Ht 5\' 3"  (1.6 m)   Wt 120 lb (54.4 kg)   LMP 12/24/2019   BMI 21.26 kg/m  Good communication with no confusion and intact memory. Alert and oriented x 3 No signs of respiratory distress during speech    Assessment and Plan:  COVID-19 virus infection Headache resolved, still no taste or smell. Cough dry started since today, no fever ever. Prednisone x 5 days prescribed, and she will send in daily temperatures. Work excuse x 1 day    Follow Up Instructions:    I discussed the assessment and treatment plan with the patient. The patient  was provided an opportunity to ask questions and all were answered. The patient agreed with the plan and demonstrated an understanding of the instructions.   The patient was advised to call back or seek an in-person evaluation if the symptoms worsen or if the condition fails to improve as anticipated.  I provided 12 minutes of non-face-to-face time during this encounter.   12/26/2019, MD

## 2020-01-04 NOTE — Patient Instructions (Addendum)
F/'U as before end January, call if you need me sooner  Prednisone is prescribed for 5 days only  Work excuse start 01/08 to return 01/08/2020, please send to patient  Please send in daily temperatures.   Thanks for choosing [facility], we consider it a privelige to serve you.

## 2020-01-04 NOTE — Assessment & Plan Note (Addendum)
Headache resolved, still no taste or smell. Cough dry started since today, no fever ever. Prednisone x 5 days prescribed, and she will send in daily temperatures. Work excuse x 1 day

## 2020-01-05 ENCOUNTER — Encounter: Payer: Self-pay | Admitting: Family Medicine

## 2020-01-05 ENCOUNTER — Encounter (INDEPENDENT_AMBULATORY_CARE_PROVIDER_SITE_OTHER): Payer: Self-pay

## 2020-01-06 ENCOUNTER — Encounter: Payer: Self-pay | Admitting: Family Medicine

## 2020-01-06 ENCOUNTER — Encounter (INDEPENDENT_AMBULATORY_CARE_PROVIDER_SITE_OTHER): Payer: Self-pay

## 2020-01-07 ENCOUNTER — Encounter (INDEPENDENT_AMBULATORY_CARE_PROVIDER_SITE_OTHER): Payer: Self-pay

## 2020-01-08 ENCOUNTER — Encounter (INDEPENDENT_AMBULATORY_CARE_PROVIDER_SITE_OTHER): Payer: Self-pay

## 2020-01-09 ENCOUNTER — Encounter (INDEPENDENT_AMBULATORY_CARE_PROVIDER_SITE_OTHER): Payer: Self-pay

## 2020-01-11 ENCOUNTER — Encounter (INDEPENDENT_AMBULATORY_CARE_PROVIDER_SITE_OTHER): Payer: Self-pay

## 2020-01-14 ENCOUNTER — Encounter (INDEPENDENT_AMBULATORY_CARE_PROVIDER_SITE_OTHER): Payer: Self-pay

## 2020-01-15 ENCOUNTER — Encounter (INDEPENDENT_AMBULATORY_CARE_PROVIDER_SITE_OTHER): Payer: Self-pay

## 2020-01-15 DIAGNOSIS — H26012 Infantile and juvenile cortical, lamellar, or zonular cataract, left eye: Secondary | ICD-10-CM | POA: Diagnosis not present

## 2020-01-16 ENCOUNTER — Encounter (INDEPENDENT_AMBULATORY_CARE_PROVIDER_SITE_OTHER): Payer: Self-pay

## 2020-01-22 ENCOUNTER — Other Ambulatory Visit: Payer: Self-pay

## 2020-01-22 ENCOUNTER — Ambulatory Visit (INDEPENDENT_AMBULATORY_CARE_PROVIDER_SITE_OTHER): Payer: BC Managed Care – PPO | Admitting: Family Medicine

## 2020-01-22 ENCOUNTER — Encounter: Payer: Self-pay | Admitting: Family Medicine

## 2020-01-22 VITALS — BP 105/68 | HR 83 | Resp 15 | Ht 63.0 in | Wt 124.0 lb

## 2020-01-22 DIAGNOSIS — E559 Vitamin D deficiency, unspecified: Secondary | ICD-10-CM

## 2020-01-22 DIAGNOSIS — L659 Nonscarring hair loss, unspecified: Secondary | ICD-10-CM

## 2020-01-22 DIAGNOSIS — Z113 Encounter for screening for infections with a predominantly sexual mode of transmission: Secondary | ICD-10-CM

## 2020-01-22 DIAGNOSIS — Z13220 Encounter for screening for lipoid disorders: Secondary | ICD-10-CM

## 2020-01-22 DIAGNOSIS — L700 Acne vulgaris: Secondary | ICD-10-CM | POA: Diagnosis not present

## 2020-01-22 NOTE — Progress Notes (Signed)
Virtual Visit via Telephone Note  I connected with Kelly [name] on 01/22/20 at  1:40 PM EST by telephone and verified that I am speaking with the correct person using two identifiers.  Location: Patient: home Provider: office   I discussed the limitations, risks, security and privacy concerns of performing an evaluation and management service by telephone and the availability of in person appointments. I also discussed with the patient that there may be a patient responsible charge related to this service. The patient expressed understanding and agreed to proceed.   History of Present Illness: F/U chronic problems, medication review, and refill medication when necessary. Review most recent labs and order labs which are due Review preventive health and update with necessary referrals or immunizations as indicated Vomit and diarrhea  x 2 hrs last night  AFTER EATING RECALLED  STORE BOUGHT HAMBURGER MEAT,started at  about 8 pm, no more nausea, last emesis around 12 midnight , vomit x 3 Approx 5 loose stool, most recent midnight. Has recovered smell and taste completely from covid, denies fever, cough, fatigue or chills Happy with current response to treatment for alopecia, has had one steroid injection and is on doxycycline, with good result Reports good control of hr acne on current regime. Exercises regularly . Feels well, no current concerns  Denies headaches, seizures, numbness, or tingling. Denies depression, anxiety or insomnia.    Observations/Objective: BP 105/68   Pulse 83   Resp 15   Ht 5\' 3"  (1.6 m)   Wt 124 lb (56.2 kg)   LMP 12/24/2019   BMI 21.97 kg/m  Good communication with no confusion and intact memory. Alert and oriented x 3 No signs of respiratory distress during speech    Assessment and Plan: Alopecia of scalp Being treated by Dermatology with topical steroid injections every 6 weeks and oral doxycycline, states experiencing hair regrowth  Acne  vulgaris Good response to topical meds continue  same    Follow Up Instructions:    I discussed the assessment and treatment plan with the patient. The patient was provided an opportunity to ask questions and all were answered. The patient agreed with the plan and demonstrated an understanding of the instructions.   The patient was advised to call back or seek an in-person evaluation if the symptoms worsen or if the condition fails to improve as anticipated.  I provided 12  minutes of non-face-to-face time during this encounter.   12/26/2019, MD

## 2020-01-22 NOTE — Patient Instructions (Addendum)
F/u [place] office with MD early September, call if you need me before  Thankful that you are having a good response to treatment as we discussed  Please get fasting labs as soon as possible ( past due , re order labs entered in 06/2019 please), VERIFY LAB WITH PATIENT We will check on your eligibility/ coverage for HPV vaccine and arrange for you to get them once they are covered by your insurance  Thankful that you are over both Covid and the acute gastroenteritis from last night  Ensure adequate fluid intake AND FOLLOW BRAT DIET Strawberry to Help Relieve Diarrhea, Adult When you have diarrhea, the foods you eat and your eating habits are very important. Choosing the right foods and drinks can help:  Relieve diarrhea.  Replace lost fluids and nutrients.  Prevent dehydration. What general guidelines should I follow?  Relieving diarrhea  Choose foods with less than 2 g or .07 oz. of fiber per serving.  Limit fats to less than 8 tsp (38 g or 1.34 oz.) a day.  Avoid the following: ? Foods and beverages sweetened with high-fructose corn syrup, honey, or sugar alcohols such as xylitol, sorbitol, and mannitol. ? Foods that contain a lot of fat or sugar. ? Fried, greasy, or spicy foods. ? High-fiber grains, breads, and cereals. ? Raw fruits and vegetables.  Eat foods that are rich in probiotics. These foods include dairy products such as yogurt and fermented milk products. They help increase healthy bacteria in the stomach and intestines (gastrointestinal tract, or GI tract).  If you have lactose intolerance, avoid dairy products. These may make your diarrhea worse.  Take medicine to help stop diarrhea (antidiarrheal medicine) only as told by your health care provider. Replacing nutrients  Eat small meals or snacks every 3-4 hours.  Eat bland foods, such as white rice, toast, or baked potato,  until your diarrhea starts to get better. Gradually reintroduce nutrient-rich foods as tolerated or as told by your health care provider. This includes: ? Well-cooked protein foods. ? Peeled, seeded, and soft-cooked fruits and vegetables. ? Low-fat dairy products.  Take vitamin and mineral supplements as told by your health care provider. Preventing dehydration  Start by sipping water or a special solution to prevent dehydration (oral rehydration solution, ORS). Urine that is clear or pale yellow means that you are getting enough fluid.  Try to drink at least 8-10 cups of fluid each day to help replace lost fluids.  You may add other liquids in addition to water, such as clear juice or decaffeinated sports drinks, as tolerated or as told by your health care provider.  Avoid drinks with caffeine, such as coffee, tea, or soft drinks.  Avoid alcohol. What foods are recommended?     The items listed may not be a complete list. Talk with your health care provider about what dietary choices are best for you. Grains White rice. White, [place], or pita breads (fresh or toasted), including plain rolls, buns, or bagels. White pasta. Saltine, soda, or graham crackers. Pretzels. Low-fiber cereal. Cooked cereals made with water (such as cornmeal, farina, or cream cereals). Plain muffins. Matzo. Melba toast. Zwieback. Vegetables Potatoes (without the skin). Most well-cooked and canned vegetables without skins or seeds. Tender lettuce. Fruits Apple sauce. Fruits canned in juice. Cooked apricots, cherries, grapefruit, peaches, pears, or plums. Fresh bananas and cantaloupe. Meats and other protein foods Baked  or boiled chicken. Eggs. Tofu. Fish. Seafood. Smooth nut butters. Ground or well-cooked tender beef, ham, veal, lamb, pork, or poultry. Dairy Plain yogurt, kefir, and unsweetened liquid yogurt. Lactose-free milk, buttermilk, skim milk, or soy milk. Low-fat or nonfat hard cheese. Beverages Water.  Low-calorie sports drinks. Fruit juices without pulp. Strained tomato and vegetable juices. Decaffeinated teas. Sugar-free beverages not sweetened with sugar alcohols. Oral rehydration solutions, if approved by your health care provider. Seasoning and other foods Bouillon, broth, or soups made from recommended foods. What foods are not recommended? The items listed may not be a complete list. Talk with your health care provider about what dietary choices are best for you. Grains Whole grain, whole wheat, bran, or rye breads, rolls, pastas, and crackers. Wild or brown rice. Whole grain or bran cereals. Barley. Oats and oatmeal. Corn tortillas or taco shells. Granola. Popcorn. Vegetables Raw vegetables. Fried vegetables. Cabbage, broccoli, Brussels sprouts, artichokes, baked beans, beet greens, corn, kale, legumes, peas, sweet potatoes, and yams. Potato skins. Cooked spinach and cabbage. Fruits Dried fruit, including raisins and dates. Raw fruits. Stewed or dried prunes. Canned fruits with syrup. Meat and other protein foods Fried or fatty meats. Deli meats. Chunky nut butters. Nuts and seeds. Beans and lentils. Kelly [name]. Hot dogs. Sausage. Dairy High-fat cheeses. Whole milk, chocolate milk, and beverages made with milk, such as milk shakes. Half-and-half. Cream. sour cream. Ice cream. Beverages Caffeinated beverages (such as coffee, tea, soda, or energy drinks). Alcoholic beverages. Fruit juices with pulp. Prune juice. Soft drinks sweetened with high-fructose corn syrup or sugar alcohols. High-calorie sports drinks. Fats and oils Butter. Cream sauces. Margarine. Salad oils. Plain salad dressings. Olives. Avocados. Mayonnaise. Sweets and desserts Sweet rolls, doughnuts, and sweet breads. Sugar-free desserts sweetened with sugar alcohols such as xylitol and sorbitol. Seasoning and other foods Honey. Hot sauce. Chili powder. Gravy. Cream-based or milk-based soups. Pancakes and  waffles. Summary  When you have diarrhea, the foods you eat and your eating habits are very important.  Make sure you get at least 8-10 cups of fluid each day, or enough to keep your urine clear or pale yellow.  Eat bland foods and gradually reintroduce healthy, nutrient-rich foods as tolerated, or as told by your health care provider.  Avoid high-fiber, fried, greasy, or spicy foods. This information is not intended to replace advice given to you by your health care provider. Make sure you discuss any questions you have with your health care provider. Document Revised: 04/06/2019 Document Reviewed: 12/11/2016 Elsevier Patient Education  2020 [employer].

## 2020-01-22 NOTE — Assessment & Plan Note (Signed)
Good response to topical meds continue  same

## 2020-01-22 NOTE — Assessment & Plan Note (Addendum)
Being treated by Dermatology with topical steroid injections every 6 weeks and oral doxycycline, states experiencing hair regrowth

## 2020-01-23 ENCOUNTER — Encounter: Payer: Self-pay | Admitting: Family Medicine

## 2020-01-23 DIAGNOSIS — L700 Acne vulgaris: Secondary | ICD-10-CM | POA: Diagnosis not present

## 2020-01-23 DIAGNOSIS — L089 Local infection of the skin and subcutaneous tissue, unspecified: Secondary | ICD-10-CM | POA: Diagnosis not present

## 2020-01-23 DIAGNOSIS — L658 Other specified nonscarring hair loss: Secondary | ICD-10-CM | POA: Diagnosis not present

## 2020-01-29 DIAGNOSIS — L659 Nonscarring hair loss, unspecified: Secondary | ICD-10-CM | POA: Diagnosis not present

## 2020-01-29 DIAGNOSIS — Z13220 Encounter for screening for lipoid disorders: Secondary | ICD-10-CM | POA: Diagnosis not present

## 2020-01-29 DIAGNOSIS — E559 Vitamin D deficiency, unspecified: Secondary | ICD-10-CM | POA: Diagnosis not present

## 2020-01-29 DIAGNOSIS — Z113 Encounter for screening for infections with a predominantly sexual mode of transmission: Secondary | ICD-10-CM | POA: Diagnosis not present

## 2020-01-30 ENCOUNTER — Other Ambulatory Visit: Payer: Self-pay | Admitting: Family Medicine

## 2020-01-30 ENCOUNTER — Encounter: Payer: Self-pay | Admitting: Family Medicine

## 2020-01-30 LAB — COMPLETE METABOLIC PANEL WITH GFR
AG Ratio: 1.4 (calc) (ref 1.0–2.5)
ALT: 8 U/L (ref 6–29)
AST: 14 U/L (ref 10–30)
Albumin: 4.5 g/dL (ref 3.6–5.1)
Alkaline phosphatase (APISO): 37 U/L (ref 31–125)
BUN: 9 mg/dL (ref 7–25)
CO2: 26 mmol/L (ref 20–32)
Calcium: 9.4 mg/dL (ref 8.6–10.2)
Chloride: 102 mmol/L (ref 98–110)
Creat: 0.59 mg/dL (ref 0.50–1.10)
GFR, Est African American: 147 mL/min/{1.73_m2} (ref >=60)
GFR, Est Non African American: 126 mL/min/{1.73_m2} (ref >=60)
Globulin: 3.2 g/dL (calc) (ref 1.9–3.7)
Glucose, Bld: 92 mg/dL (ref 65–99)
Potassium: 3.7 mmol/L (ref 3.5–5.3)
Sodium: 137 mmol/L (ref 135–146)
Total Bilirubin: 0.8 mg/dL (ref 0.2–1.2)
Total Protein: 7.7 g/dL (ref 6.1–8.1)

## 2020-01-30 LAB — VITAMIN D 25 HYDROXY (VIT D DEFICIENCY, FRACTURES): Vit D, 25-Hydroxy: 12 ng/mL — ABNORMAL LOW (ref 30–100)

## 2020-01-30 LAB — CBC
HCT: 40.6 % (ref 35.0–45.0)
Hemoglobin: 13.9 g/dL (ref 11.7–15.5)
MCH: 31.6 pg (ref 27.0–33.0)
MCHC: 34.2 g/dL (ref 32.0–36.0)
MCV: 92.3 fL (ref 80.0–100.0)
MPV: 11.4 fL (ref 7.5–12.5)
Platelets: 309 10*3/uL (ref 140–400)
RBC: 4.4 10*6/uL (ref 3.80–5.10)
RDW: 11.7 % (ref 11.0–15.0)
WBC: 4.6 10*3/uL (ref 3.8–10.8)

## 2020-01-30 LAB — LIPID PANEL
Cholesterol: 173 mg/dL (ref <200)
HDL: 56 mg/dL (ref >=50)
LDL Cholesterol (Calc): 103 mg/dL (calc) — ABNORMAL HIGH
Non-HDL Cholesterol (Calc): 117 mg/dL (calc) (ref <130)
Total CHOL/HDL Ratio: 3.1 (calc) (ref <5.0)
Triglycerides: 48 mg/dL (ref <150)

## 2020-01-30 LAB — HIV ANTIBODY (ROUTINE TESTING W REFLEX): HIV 1&2 Ab, 4th Generation: NONREACTIVE

## 2020-01-30 LAB — TSH: TSH: 0.97 mIU/L

## 2020-01-30 MED ORDER — ERGOCALCIFEROL 1.25 MG (50000 UT) PO CAPS: 50000.0000 [IU] | ORAL_CAPSULE | ORAL | 1 refills | Status: DC

## 2020-02-15 ENCOUNTER — Ambulatory Visit: Payer: BC Managed Care – PPO

## 2020-02-22 ENCOUNTER — Other Ambulatory Visit: Payer: Self-pay

## 2020-02-22 ENCOUNTER — Ambulatory Visit (INDEPENDENT_AMBULATORY_CARE_PROVIDER_SITE_OTHER): Payer: BC Managed Care – PPO

## 2020-02-22 DIAGNOSIS — Z23 Encounter for immunization: Secondary | ICD-10-CM

## 2020-03-18 ENCOUNTER — Telehealth: Payer: Self-pay | Admitting: Family Medicine

## 2020-03-18 ENCOUNTER — Encounter: Payer: Self-pay | Admitting: Family Medicine

## 2020-03-18 DIAGNOSIS — G8929 Other chronic pain: Secondary | ICD-10-CM

## 2020-03-18 DIAGNOSIS — R519 Headache, unspecified: Secondary | ICD-10-CM

## 2020-03-18 NOTE — Telephone Encounter (Signed)
Please see pt message , the response I sent her and follow through re f/u of her headache Please advise her to discuss skin issue with the dermatologist who follows her, I will also specifically send her another message re the skin  Thanks, questions, please ask!

## 2020-03-19 NOTE — Addendum Note (Signed)
Addended by: Abner Greenspan on: 03/19/2020 08:34 AM   Modules accepted: Orders

## 2020-03-19 NOTE — Telephone Encounter (Signed)
Spoke with pt and referred to neurology in [place] per pt

## 2020-03-20 DIAGNOSIS — L089 Local infection of the skin and subcutaneous tissue, unspecified: Secondary | ICD-10-CM | POA: Diagnosis not present

## 2020-03-20 DIAGNOSIS — L658 Other specified nonscarring hair loss: Secondary | ICD-10-CM | POA: Diagnosis not present

## 2020-03-20 DIAGNOSIS — L700 Acne vulgaris: Secondary | ICD-10-CM | POA: Diagnosis not present

## 2020-05-23 ENCOUNTER — Ambulatory Visit (INDEPENDENT_AMBULATORY_CARE_PROVIDER_SITE_OTHER): Payer: BC Managed Care – PPO | Admitting: *Deleted

## 2020-05-23 ENCOUNTER — Other Ambulatory Visit: Payer: Self-pay

## 2020-05-23 DIAGNOSIS — Z23 Encounter for immunization: Secondary | ICD-10-CM

## 2020-05-23 DIAGNOSIS — B977 Papillomavirus as the cause of diseases classified elsewhere: Secondary | ICD-10-CM | POA: Diagnosis not present

## 2020-06-03 ENCOUNTER — Encounter: Payer: Self-pay | Admitting: Family Medicine

## 2020-07-09 DIAGNOSIS — L658 Other specified nonscarring hair loss: Secondary | ICD-10-CM | POA: Diagnosis not present

## 2020-07-09 DIAGNOSIS — L089 Local infection of the skin and subcutaneous tissue, unspecified: Secondary | ICD-10-CM | POA: Diagnosis not present

## 2020-07-09 DIAGNOSIS — L700 Acne vulgaris: Secondary | ICD-10-CM | POA: Diagnosis not present

## 2020-07-22 ENCOUNTER — Other Ambulatory Visit: Payer: Self-pay | Admitting: Family Medicine

## 2020-08-26 ENCOUNTER — Ambulatory Visit: Payer: BC Managed Care – PPO

## 2020-09-03 ENCOUNTER — Ambulatory Visit: Payer: BC Managed Care – PPO | Admitting: Family Medicine

## 2020-10-08 DIAGNOSIS — L700 Acne vulgaris: Secondary | ICD-10-CM | POA: Diagnosis not present

## 2020-10-08 DIAGNOSIS — L089 Local infection of the skin and subcutaneous tissue, unspecified: Secondary | ICD-10-CM | POA: Diagnosis not present

## 2020-10-08 DIAGNOSIS — L658 Other specified nonscarring hair loss: Secondary | ICD-10-CM | POA: Diagnosis not present

## 2020-12-05 ENCOUNTER — Encounter: Payer: Self-pay | Admitting: Family Medicine

## 2020-12-09 ENCOUNTER — Encounter: Payer: Self-pay | Admitting: Family Medicine

## 2020-12-09 ENCOUNTER — Ambulatory Visit: Payer: BC Managed Care – PPO | Admitting: Family Medicine

## 2020-12-09 ENCOUNTER — Other Ambulatory Visit: Payer: Self-pay

## 2020-12-09 VITALS — BP 110/68 | HR 78 | Temp 98.5°F | Ht 64.0 in | Wt 122.0 lb

## 2020-12-09 DIAGNOSIS — Z1159 Encounter for screening for other viral diseases: Secondary | ICD-10-CM

## 2020-12-09 DIAGNOSIS — L239 Allergic contact dermatitis, unspecified cause: Secondary | ICD-10-CM | POA: Diagnosis not present

## 2020-12-09 DIAGNOSIS — L659 Nonscarring hair loss, unspecified: Secondary | ICD-10-CM

## 2020-12-09 DIAGNOSIS — L700 Acne vulgaris: Secondary | ICD-10-CM | POA: Diagnosis not present

## 2020-12-09 DIAGNOSIS — Z23 Encounter for immunization: Secondary | ICD-10-CM | POA: Diagnosis not present

## 2020-12-09 DIAGNOSIS — E559 Vitamin D deficiency, unspecified: Secondary | ICD-10-CM

## 2020-12-09 DIAGNOSIS — Z9109 Other allergy status, other than to drugs and biological substances: Secondary | ICD-10-CM

## 2020-12-09 DIAGNOSIS — Z13220 Encounter for screening for lipoid disorders: Secondary | ICD-10-CM

## 2020-12-09 NOTE — Progress Notes (Signed)
   KYRIAKI MODER     MRN: [id number]      DOB: February 08, 1991   HPI Ms. Magnussen is here for follow up and re-evaluation of chronic medical conditions, medication management and review of any available recent lab and radiology data.  C/o concern re uncontrolled allergies has excess Spring allergies,pollen, watery eyes and nose and itchy eyes, alos sometimes in the Fall Recent outbreak on chin, 1 week ago after changing her detergent, and sleeping with freshly laundered linnen, requests referral to allergist  ROS Denies recent fever or chills. Denies sinus pressure, nasal congestion, ear pain or sore throat. Denies chest congestion, productive cough or wheezing. Denies chest pains, palpitations and leg swelling Denies abdominal pain, nausea, vomiting,diarrhea or constipation.   Denies dysuria, frequency, hesitancy or incontinence. Denies joint pain, swelling and limitation in mobility. Denies headaches, seizures, numbness, or tingling. Denies depression, anxiety or insomnia.    PE  BP 110/68 (BP Location: Right Arm, Patient Position: Sitting, Cuff Size: Normal)   Pulse 78   Temp 98.5 F (36.9 C) (Temporal)   Ht 5\' 4"  (1.626 m)   Wt 122 lb (55.3 kg)   LMP 11/26/2020   SpO2 98%   BMI 20.94 kg/m   Patient alert and oriented and in no cardiopulmonary distress.  HEENT: No facial asymmetry, EOMI,     Neck supple .  Chest: Clear to auscultation bilaterally.  CVS: S1, S2 no murmurs, no S3.Regular rate.  ABD: Soft non tender.   Ext: No edema  MS: Adequate ROM spine, shoulders, hips and knees.  Skin: Intact,eyhtematous maculo papular rash on chin, facial acne, allopecia  Psych: Good eye contact, normal affect. Memory intact not anxious or depressed appearing.  CNS: CN 2-12 intact, power,  normal throughout.no focal deficits noted.   Assessment & Plan  Allergic dermatitis New facial rash x 1 week with new detergent, refer for formal allergy testing per pt request. A;lso  reports Spring and Fall allergy respiratory symptoms  Acne vulgaris Stable and fair control on current regime, managed by Dermatology  Alopecia of scalp repoort steady progress with current regime , managed by dermatology  '

## 2020-12-09 NOTE — Patient Instructions (Addendum)
F/u IN OFFICE WITH MD  IN 6 MONTHS, CALL IF YOU NEED ME SOONER  Flu and Gardasil # 3 vaccines today  Please get your covid booster  Fasting CBC, lipid, cp and EGFR, tSH, vit D and hepatitis C screen mid February  Your pap is past due need toschedule and get this done  You are referred for allergy testing  Thankful skin is improving  It is important that you exercise regularly at least 30 minutes 5 times a week. If you develop chest pain, have severe difficulty breathing, or feel very tired, stop exercising immediately and seek medical attention  Think about what you will eat, plan ahead. Choose " clean, green, fresh or frozen" over canned, processed or packaged foods which are more sugary, salty and fatty. 70 to 75% of food eaten should be vegetables and fruit. Three meals at set times with snacks allowed between meals, but they must be fruit or vegetables. Aim to eat over a 12 hour period , example 7 am to 7 pm, and STOP after  your last meal of the day. Drink water,generally about 64 ounces per day, no other drink is as healthy. Fruit juice is best enjoyed in a healthy way, by EATING the fruit. Thanks for choosing [facility], we consider it a privelige to serve you.

## 2020-12-10 DIAGNOSIS — L700 Acne vulgaris: Secondary | ICD-10-CM | POA: Diagnosis not present

## 2020-12-10 DIAGNOSIS — L089 Local infection of the skin and subcutaneous tissue, unspecified: Secondary | ICD-10-CM | POA: Diagnosis not present

## 2020-12-10 DIAGNOSIS — L658 Other specified nonscarring hair loss: Secondary | ICD-10-CM | POA: Diagnosis not present

## 2020-12-13 ENCOUNTER — Encounter: Payer: Self-pay | Admitting: Family Medicine

## 2020-12-13 NOTE — Assessment & Plan Note (Signed)
repoort steady progress with current regime , managed by dermatology

## 2020-12-13 NOTE — Assessment & Plan Note (Signed)
New facial rash x 1 week with new detergent, refer for formal allergy testing per pt request. A;lso reports Spring and Fall allergy respiratory symptoms

## 2020-12-13 NOTE — Assessment & Plan Note (Addendum)
Stable and fair control on current regime, managed by Dermatology

## 2021-01-15 ENCOUNTER — Encounter: Payer: Self-pay | Admitting: Allergy

## 2021-01-15 ENCOUNTER — Other Ambulatory Visit: Payer: Self-pay

## 2021-01-15 ENCOUNTER — Ambulatory Visit: Payer: BC Managed Care – PPO | Admitting: Allergy

## 2021-01-15 VITALS — BP 110/70 | HR 95 | Temp 98.0°F | Ht 64.0 in | Wt 123.4 lb

## 2021-01-15 DIAGNOSIS — L309 Dermatitis, unspecified: Secondary | ICD-10-CM | POA: Diagnosis not present

## 2021-01-15 DIAGNOSIS — H1013 Acute atopic conjunctivitis, bilateral: Secondary | ICD-10-CM | POA: Diagnosis not present

## 2021-01-15 DIAGNOSIS — J3089 Other allergic rhinitis: Secondary | ICD-10-CM | POA: Diagnosis not present

## 2021-01-15 MED ORDER — MONTELUKAST SODIUM 10 MG PO TABS: 10.0000 mg | ORAL_TABLET | Freq: Every day | ORAL | 5 refills | Status: DC

## 2021-01-15 MED ORDER — OLOPATADINE HCL 0.2 % OP SOLN: 1.0000 [drp] | Freq: Every day | OPHTHALMIC | 5 refills | Status: DC | PRN

## 2021-01-15 NOTE — Progress Notes (Unsigned)
New Patient Note  RE: Kelly [name] MRN: [id number] DOB: Feb 24, 1991 Date of Office Visit: 01/15/2021  Referring provider: Kerri Perches, MD Primary care provider: Kerri Perches, MD  Chief Complaint: Allergic Rhinitis  (Fall/ spring is her usual flares but now she also has flares in the winter time. /Itchy eyes, runny nose, and sore throat )  History of Present Illness: I had the pleasure of seeing Kelly [name] for initial evaluation at the [facility] on 01/16/2021. She is a 30 y.o. female, who is referred here by Kerri Perches, MD for the evaluation of allergic rhinitis.   She reports symptoms of itchy/watery eyes, rhinorrhea, sneezing, sore throat. Symptoms have been going on for 20+ years. The symptoms are present mainly in the spring and fall but noticed some symptoms in the winter as well. Other triggers include exposure to unknown. Anosmia: no. Headache: sometimes. She has used Claritin, benadryl, allegra, with fair improvement in symptoms. Zyrtec caused nosebleeds. Sinus infections: no. Previous work up includes: none. Previous ENT evaluation: yes but for ear issues. Previous sinus imaging: no. History of nasal polyps: no. Last eye exam: last year History of reflux: no.  12/09/2020 PCP visit: "[name] is here for follow up and re-evaluation of chronic medical conditions, medication management and review of any available recent lab and radiology data.  C/o concern re uncontrolled allergies has excess Spring allergies,pollen, watery eyes and nose and itchy eyes, alos sometimes in the Fall Recent outbreak on chin, 1 week ago after changing her detergent, and sleeping with freshly laundered linnen, requests referral to allergist"  Assessment and Plan: Kelly [name] is a 29 y.o. female with: Other allergic rhinitis Rhinoconjunctivitis symptoms for 20+ years mainly in the spring and fall.  Noticed symptoms this winter as well.  Tried  over-the-counter antihistamines with some benefit.  Zyrtec caused nosebleeds.   Today's skin testing showed: Positive to grass pollen, weed, ragweed, trees, mold.  Start environmental control measures as below.  May use over the counter antihistamines such as Claritin (loratadine), Allegra (fexofenadine), or Xyzal (levocetirizine) daily as needed. May take it twice a day during flares.  Start Singulair (montelukast) 10mg  daily at night. Cautioned that in some children/adults can experience behavioral changes including hyperactivity, agitation, depression, sleep disturbances and suicidal ideations. These side effects are rare, but if you notice them you should notify me and discontinue Singulair (montelukast).  May use olopatadine eye drops 0.2% once a day as needed for itchy/watery eyes.  Declines nasal sprays.   Had a detailed discussion with patient/family that clinical history is suggestive of allergic rhinitis, and may benefit from allergy immunotherapy (AIT). Discussed in detail regarding the dosing, schedule, side effects (mild to moderate local allergic reaction and rarely systemic allergic reactions including anaphylaxis), and benefits (significant improvement in nasal symptoms, seasonal flares of asthma) of immunotherapy with the patient. There is significant time commitment involved with allergy shots, which includes weekly immunotherapy injections for first 9-12 months and then biweekly to monthly injections for 3-5 years.   Read about allergy injections - handout given.   Allergic conjunctivitis of both eyes  See assessment and plan as above for allergic rhinitis.  Dermatitis Rash on the face resolved after switching back to her usual laundry detergent.  Monitor symptoms.  Return in about 3 months (around 04/15/2021).  Meds ordered this encounter  Medications  . Olopatadine HCl 0.2 % SOLN    Sig: Apply 1 drop to eye daily  as needed (itchy/watery eyes).    Dispense:  2.5 mL     Refill:  5  . montelukast (SINGULAIR) 10 MG tablet    Sig: Take 1 tablet (10 mg total) by mouth at bedtime.    Dispense:  30 tablet    Refill:  5   Other allergy screening: Asthma: no Food allergy: no  Chocolate causes some facial breaks out the following day and resolves within 3 days.  Medication allergy: yes  Penicillin - hives.  Hymenoptera allergy: no Urticaria: no Eczema:no History of recurrent infections suggestive of immunodeficency: no  Diagnostics: Skin Testing: Environmental allergy panel. Positive to grass pollen, weed, ragweed, trees, mold. Results discussed with patient/family.  Airborne Adult Perc - 01/15/21 1546    Time Antigen Placed 1546    Allergen Manufacturer Waynette ButteryGreer    Location Back    Number of Test 59    Panel 1 Select    1. Control-Buffer 50% Glycerol Negative    2. Control-Histamine 1 mg/ml 2+    3. Albumin saline Negative    4. Bahia 4+    5. French Southern Territories[place] --   +/-   6. Johnson 3+    7. Kentucky Blue Negative    8. Meadow Fescue Negative    9. Perennial Rye Negative    10. Sweet Vernal 3+    11. Timothy 2+    12. Cocklebur Negative    13. Burweed Marshelder Negative    14. Ragweed, short Negative    15. Ragweed, Giant Negative    16. Plantain,  English Negative    17. Lamb's Quarters Negative    18. Sheep Sorrell Negative    19. Rough Pigweed Negative    20. Marsh Elder, Rough Negative    21. Mugwort, Common Negative    22. Ash mix Negative    23. Birch mix Negative    24. Beech American Negative    25. Box, Elder Negative    26. Cedar, red Negative    27. Cottonwood, Guinea-BissauEastern Negative    28. Elm mix Negative    29. Hickory Negative    30. Maple mix Negative    31. Oak, Guinea-Bissau[place]astern mix Negative    32. Pecan Pollen Negative    33. Pine mix Negative    34. Sycamore Eastern Negative    35. Walnut, Black Pollen Negative    36. Alternaria alternata Negative    37. Cladosporium Herbarum Negative    38. Aspergillus mix Negative    39.  Penicillium mix Negative    40. Bipolaris sorokiniana (Helminthosporium) Negative    41. Drechslera spicifera (Curvularia) Negative    42. Mucor plumbeus Negative    43. Fusarium moniliforme Negative    44. Aureobasidium pullulans (pullulara) Negative    45. Rhizopus oryzae Negative    46. Botrytis cinera Negative    47. Epicoccum nigrum Negative    48. Phoma betae Negative    49. Candida Albicans Negative    50. Trichophyton mentagrophytes Negative    51. Mite, D Farinae  5,000 AU/ml Negative    52. Mite, D Pteronyssinus  5,000 AU/ml Negative    53. Cat Hair 10,000 BAU/ml Negative    54.  Dog Epithelia Negative    55. Mixed Feathers Negative    56. Horse Epithelia Negative    57. Cockroach, German Negative    58. Mouse Negative    59. Tobacco Leaf Negative          Intradermal - 01/15/21 1606  Time Antigen Placed 1606    Allergen Manufacturer Greer    Location Arm    Number of Test 12    Intradermal Select    Control Negative    French Southern Territ[place] Omitted    Johnson Omitted    Ragweed mix 3+    Weed mix 3+    Tree mix 3+    Mold 1 2+    Mold 2 2+    Mold 3 Negative    Mold 4 Negative    Cat Negative    Dog Negative    Cockroach Negative    Mite mix Negative           Past Medical History: Patient Active Problem List   Diagnosis Date Noted  . Other allergic rhinitis 01/16/2021  . Allergic conjunctivitis of both eyes 01/16/2021  . Dermatitis 01/16/2021  . Allergic dermatitis 12/13/2020  . Alopecia of scalp 01/21/2019  . Acne vulgaris 04/20/2017   Past Medical History:  Diagnosis Date  . Allergy    penicillin  . Vertigo    Past Surgical History: Past Surgical History:  Procedure Laterality Date  . ADENOIDECTOMY    . TONSILLECTOMY     under 12    Medication List:  Current Outpatient Medications  Medication Sig Dispense Refill  . Clindamycin-Benzoyl Per, Refr, gel Apply topically daily.    . clobetasol (TEMOVATE) 0.05 % external solution Apply topically.     Marland Kitchen doxycycline (VIBRAMYCIN) 50 MG capsule Take 1 capsule (50 mg total) by mouth daily. 30 capsule 5  . montelukast (SINGULAIR) 10 MG tablet Take 1 tablet (10 mg total) by mouth at bedtime. 30 tablet 5  . Olopatadine HCl 0.2 % SOLN Apply 1 drop to eye daily as needed (itchy/watery eyes). 2.5 mL 5  . spironolactone (ALDACTONE) 50 MG tablet Take 1 pill in the morning after breakfast.     No current facility-administered medications for this visit.   Allergies: Allergies  Allergen Reactions  . Penicillins Rash   Social History: Social History   Socioeconomic History  . Marital status: Single    Spouse name: Not on file  . Number of children: 0  . Years of education: 10  . Highest education level: Not on file  Occupational History  . Occupation: HR director    Comment: [employer]  Tobacco Use  . Smoking status: Never Smoker  . Smokeless tobacco: Never Used  Vaping Use  . Vaping Use: Never used  Substance and Sexual Activity  . Alcohol use: No  . Drug use: No  . Sexual activity: Not on file  Other Topics Concern  . Not on file  Social History Narrative   Graduated   Occupational hygienist for [employer]   Lives in [place] - lives alone   Social Determinants of Health   Financial Resource Strain: Not on file  Food Insecurity: Not on file  Transportation Needs: Not on file  Physical Activity: Not on file  Stress: Not on file  Social Connections: Not on file   Lives in a townhome. Smoking: denies Occupation: HR Hospital doctor HistorySurveyor, minerals in the house: no Engineer, civil (consulting) in the family room: yes Carpet in the bedroom: yes Heating: gas Cooling: central Pet: no  Family History: Family History  Problem Relation Age of Onset  . Hypertension Mother   . Hypertension Father   . Hypertension Sister   . Hypertension Maternal Grandmother   . Hypertension Maternal Grandfather   . Hypertension Paternal Grandmother   .  Hypertension Paternal Grandfather     Problem                               Relation Allergic rhino conjunctivitis     Sister   Review of Systems  Constitutional: Negative for appetite change, chills, fever and unexpected weight change.  HENT: Positive for rhinorrhea, sneezing and sore throat. Negative for congestion.   Eyes: Positive for itching.  Respiratory: Negative for cough, chest tightness, shortness of breath and wheezing.   Cardiovascular: Negative for chest pain.  Gastrointestinal: Negative for abdominal pain.  Genitourinary: Negative for difficulty urinating.  Skin: Negative for rash.  Allergic/Immunologic: Positive for environmental allergies.  Neurological: Negative for headaches.   Objective: BP 110/70   Pulse 95   Temp 98 F (36.7 C)   Ht 5\' 4"  (1.626 m)   Wt 123 lb 6.4 oz (56 kg)   SpO2 97%   BMI 21.18 kg/m  Body mass index is 21.18 kg/m. Physical Exam Vitals and nursing note reviewed.  Constitutional:      Appearance: Normal appearance. She is well-developed.  HENT:     Head: Normocephalic and atraumatic.     Right Ear: Tympanic membrane and external ear normal.     Left Ear: Tympanic membrane and external ear normal.     Nose: Nose normal.     Mouth/Throat:     Mouth: Mucous membranes are moist.     Pharynx: Oropharynx is clear.  Eyes:     Conjunctiva/sclera: Conjunctivae normal.  Cardiovascular:     Rate and Rhythm: Normal rate and regular rhythm.     Heart sounds: Normal heart sounds. No murmur heard. No friction rub. No gallop.   Pulmonary:     Effort: Pulmonary effort is normal.     Breath sounds: Normal breath sounds. No wheezing, rhonchi or rales.  Musculoskeletal:     Cervical back: Neck supple.  Skin:    General: Skin is warm.     Findings: No rash.  Neurological:     Mental Status: She is alert and oriented to person, place, and time.  Psychiatric:        Behavior: Behavior normal.    The plan was reviewed with the patient/family, and all questions/concerned were  addressed.  It was my pleasure to see R today and participate in her care. Please feel free to contact me with any questions or concerns.  Sincerely,  [place], DO Allergy & Immunology  [facility] office: [phone] [facility] office: [phone]

## 2021-01-15 NOTE — Patient Instructions (Addendum)
Today's skin testing showed: Positive to grass pollen, weed, ragweed, trees, mold.  Environmental allergies  Start environmental control measures as below.  May use over the counter antihistamines such as Claritin (loratadine), Allegra (fexofenadine), or Xyzal (levocetirizine) daily as needed. May take it twice a day during flares.  Start Singulair (montelukast) 10mg  daily at night. Cautioned that in some children/adults can experience behavioral changes including hyperactivity, agitation, depression, sleep disturbances and suicidal ideations. These side effects are rare, but if you notice them you should notify me and discontinue Singulair (montelukast).  May use olopatadine eye drops 0.2% once a day as needed for itchy/watery eyes.  Had a detailed discussion with patient/family that clinical history is suggestive of allergic rhinitis, and may benefit from allergy immunotherapy (AIT). Discussed in detail regarding the dosing, schedule, side effects (mild to moderate local allergic reaction and rarely systemic allergic reactions including anaphylaxis), and benefits (significant improvement in nasal symptoms, seasonal flares of asthma) of immunotherapy with the patient. There is significant time commitment involved with allergy shots, which includes weekly immunotherapy injections for first 9-12 months and then biweekly to monthly injections for 3-5 years.   Read about allergy injections - handout given.   Follow up in 3 months or sooner if needed.   Reducing Pollen Exposure . Pollen seasons: trees (spring), grass (summer) and ragweed/weeds (fall). [birth year]1981 Keep windows closed in your home and car to lower pollen exposure.  Marland Kitchen air conditioning in the bedroom and throughout the house if possible.  . Avoid going out in dry windy days - especially early morning. . Pollen counts are highest between 5 - 10 AM and on dry, hot and windy days.  . Save outside activities for late afternoon or after a  heavy rain, when pollen levels are lower.  . Avoid mowing of grass if you have grass pollen allergy. Kelly [name] Be aware that pollen can also be transported indoors on people and pets.  . Dry your clothes in an automatic dryer rather than hanging them outside where they might collect pollen.  . Rinse hair and eyes before bedtime.  Mold Control . Mold and fungi can grow on a variety of surfaces provided certain temperature and moisture conditions exist.  . Outdoor molds grow on plants, decaying vegetation and soil. The major outdoor mold, Alternaria and Cladosporium, are found in very high numbers during hot and dry conditions. Generally, a late summer - fall peak is seen for common outdoor fungal spores. Rain will temporarily lower outdoor mold spore count, but counts rise rapidly when the rainy period ends. . The most important indoor molds are Aspergillus and Penicillium. Dark, humid and poorly ventilated basements are ideal sites for mold growth. The next most common sites of mold growth are the bathroom and the kitchen. Outdoor (Seasonal) Mold Control . Use air conditioning and keep windows closed. . Avoid exposure to decaying vegetation. 10-[birth year]-1976 Avoid leaf raking. . Avoid grain handling. . Consider wearing a face mask if working in moldy areas.  Indoor (Perennial) Mold Control  . Maintain humidity below 50%. . Get rid of mold growth on hard surfaces with water, detergent and, if necessary, 5% bleach (do not mix with other cleaners). Then dry the area completely. If mold covers an area more than 10 square feet, consider hiring an indoor environmental professional. . For clothing, washing with soap and water is best. If moldy items cannot be cleaned and dried, throw them away. . Remove sources e.g. contaminated carpets. . Repair and seal leaking  roofs or pipes. Using dehumidifiers in damp basements may be helpful, but empty the water and clean units regularly to prevent mildew from forming. All rooms,  especially basements, bathrooms and kitchens, require ventilation and cleaning to deter mold and mildew growth. Avoid carpeting on concrete or damp floors, and storing items in damp areas.

## 2021-01-16 ENCOUNTER — Encounter: Payer: Self-pay | Admitting: Allergy

## 2021-01-16 DIAGNOSIS — H1013 Acute atopic conjunctivitis, bilateral: Secondary | ICD-10-CM | POA: Insufficient documentation

## 2021-01-16 DIAGNOSIS — L309 Dermatitis, unspecified: Secondary | ICD-10-CM | POA: Insufficient documentation

## 2021-01-16 DIAGNOSIS — J3089 Other allergic rhinitis: Secondary | ICD-10-CM | POA: Insufficient documentation

## 2021-01-16 NOTE — Assessment & Plan Note (Signed)
Rash on the face resolved after switching back to her usual laundry detergent.  Monitor symptoms.

## 2021-01-16 NOTE — Assessment & Plan Note (Signed)
   See assessment and plan as above for allergic rhinitis.  

## 2021-01-16 NOTE — Assessment & Plan Note (Signed)
Rhinoconjunctivitis symptoms for 20+ years mainly in the spring and fall.  Noticed symptoms this winter as well.  Tried over-the-counter antihistamines with some benefit.  Zyrtec caused nosebleeds.   Today's skin testing showed: Positive to grass pollen, weed, ragweed, trees, mold.  Start environmental control measures as below.  May use over the counter antihistamines such as Claritin (loratadine), Allegra (fexofenadine), or Xyzal (levocetirizine) daily as needed. May take it twice a day during flares.  Start Singulair (montelukast) 10mg  daily at night. Cautioned that in some children/adults can experience behavioral changes including hyperactivity, agitation, depression, sleep disturbances and suicidal ideations. These side effects are rare, but if you notice them you should notify me and discontinue Singulair (montelukast).  May use olopatadine eye drops 0.2% once a day as needed for itchy/watery eyes.  Declines nasal sprays.   Had a detailed discussion with patient/family that clinical history is suggestive of allergic rhinitis, and may benefit from allergy immunotherapy (AIT). Discussed in detail regarding the dosing, schedule, side effects (mild to moderate local allergic reaction and rarely systemic allergic reactions including anaphylaxis), and benefits (significant improvement in nasal symptoms, seasonal flares of asthma) of immunotherapy with the patient. There is significant time commitment involved with allergy shots, which includes weekly immunotherapy injections for first 9-12 months and then biweekly to monthly injections for 3-5 years.   Read about allergy injections - handout given.

## 2021-01-30 ENCOUNTER — Other Ambulatory Visit: Payer: BC Managed Care – PPO | Admitting: Adult Health

## 2021-02-06 ENCOUNTER — Other Ambulatory Visit: Payer: BC Managed Care – PPO | Admitting: Adult Health

## 2021-02-11 DIAGNOSIS — L700 Acne vulgaris: Secondary | ICD-10-CM | POA: Diagnosis not present

## 2021-02-11 DIAGNOSIS — L089 Local infection of the skin and subcutaneous tissue, unspecified: Secondary | ICD-10-CM | POA: Diagnosis not present

## 2021-02-11 DIAGNOSIS — L658 Other specified nonscarring hair loss: Secondary | ICD-10-CM | POA: Diagnosis not present

## 2021-02-13 ENCOUNTER — Encounter: Payer: Self-pay | Admitting: Adult Health

## 2021-03-11 DIAGNOSIS — Z13220 Encounter for screening for lipoid disorders: Secondary | ICD-10-CM | POA: Diagnosis not present

## 2021-03-11 DIAGNOSIS — Z1159 Encounter for screening for other viral diseases: Secondary | ICD-10-CM | POA: Diagnosis not present

## 2021-03-11 DIAGNOSIS — E559 Vitamin D deficiency, unspecified: Secondary | ICD-10-CM | POA: Diagnosis not present

## 2021-03-12 LAB — LIPID PANEL
Chol/HDL Ratio: 2.6 ratio (ref 0.0–4.4)
Cholesterol, Total: 215 mg/dL — ABNORMAL HIGH (ref 100–199)
HDL: 84 mg/dL (ref >39)
LDL Chol Calc (NIH): 119 mg/dL — ABNORMAL HIGH (ref 0–99)
Triglycerides: 66 mg/dL (ref 0–149)
VLDL Cholesterol Cal: 12 mg/dL (ref 5–40)

## 2021-03-12 LAB — CMP14+EGFR
ALT: 9 IU/L (ref 0–32)
AST: 22 IU/L (ref 0–40)
Albumin/Globulin Ratio: 1.4 (ref 1.2–2.2)
Albumin: 4.9 g/dL (ref 3.9–5.0)
Alkaline Phosphatase: 40 IU/L — ABNORMAL LOW (ref 44–121)
BUN/Creatinine Ratio: 8 — ABNORMAL LOW (ref 9–23)
BUN: 6 mg/dL (ref 6–20)
Bilirubin Total: 0.6 mg/dL (ref 0.0–1.2)
CO2: 21 mmol/L (ref 20–29)
Calcium: 9.7 mg/dL (ref 8.7–10.2)
Chloride: 98 mmol/L (ref 96–106)
Creatinine, Ser: 0.71 mg/dL (ref 0.57–1.00)
Globulin, Total: 3.4 g/dL (ref 1.5–4.5)
Glucose: 85 mg/dL (ref 65–99)
Potassium: 4 mmol/L (ref 3.5–5.2)
Sodium: 137 mmol/L (ref 134–144)
Total Protein: 8.3 g/dL (ref 6.0–8.5)
eGFR: 119 mL/min/{1.73_m2} (ref >59)

## 2021-03-12 LAB — CBC
Hematocrit: 41.1 % (ref 34.0–46.6)
Hemoglobin: 13.5 g/dL (ref 11.1–15.9)
MCH: 30.8 pg (ref 26.6–33.0)
MCHC: 32.8 g/dL (ref 31.5–35.7)
MCV: 94 fL (ref 79–97)
Platelets: 311 10*3/uL (ref 150–450)
RBC: 4.38 x10E6/uL (ref 3.77–5.28)
RDW: 11.4 % — ABNORMAL LOW (ref 11.7–15.4)
WBC: 4.8 10*3/uL (ref 3.4–10.8)

## 2021-03-12 LAB — VITAMIN D 25 HYDROXY (VIT D DEFICIENCY, FRACTURES): Vit D, 25-Hydroxy: 18.8 ng/mL — ABNORMAL LOW (ref 30.0–100.0)

## 2021-03-12 LAB — TSH: TSH: 1.47 u[IU]/mL (ref 0.450–4.500)

## 2021-03-12 LAB — HEPATITIS C ANTIBODY: Hep C Virus Ab: <0.1 s/co ratio (ref 0.0–0.9)

## 2021-03-13 ENCOUNTER — Other Ambulatory Visit: Payer: Self-pay | Admitting: Family Medicine

## 2021-03-13 MED ORDER — ERGOCALCIFEROL 1.25 MG (50000 UT) PO CAPS: 50000.0000 [IU] | ORAL_CAPSULE | ORAL | 1 refills | Status: DC

## 2021-03-20 ENCOUNTER — Other Ambulatory Visit: Payer: BC Managed Care – PPO | Admitting: Adult Health

## 2021-04-15 ENCOUNTER — Ambulatory Visit (INDEPENDENT_AMBULATORY_CARE_PROVIDER_SITE_OTHER): Payer: BC Managed Care – PPO | Admitting: Adult Health

## 2021-04-15 ENCOUNTER — Other Ambulatory Visit: Payer: Self-pay

## 2021-04-15 ENCOUNTER — Other Ambulatory Visit (HOSPITAL_COMMUNITY)
Admission: RE | Admit: 2021-04-15 | Discharge: 2021-04-15 | Disposition: A | Discharge status: Home / Self Care | Payer: BC Managed Care – PPO | Source: Ambulatory Visit | Attending: Adult Health | Admitting: Adult Health

## 2021-04-15 ENCOUNTER — Encounter: Payer: Self-pay | Admitting: Adult Health

## 2021-04-15 VITALS — BP 120/81 | HR 89 | Ht 64.0 in | Wt 118.0 lb

## 2021-04-15 DIAGNOSIS — Z30011 Encounter for initial prescription of contraceptive pills: Secondary | ICD-10-CM | POA: Insufficient documentation

## 2021-04-15 DIAGNOSIS — Z01419 Encounter for gynecological examination (general) (routine) without abnormal findings: Secondary | ICD-10-CM | POA: Insufficient documentation

## 2021-04-15 DIAGNOSIS — Z8679 Personal history of other diseases of the circulatory system: Secondary | ICD-10-CM

## 2021-04-15 DIAGNOSIS — Z0000 Encounter for general adult medical examination without abnormal findings: Secondary | ICD-10-CM | POA: Insufficient documentation

## 2021-04-15 MED ORDER — LO LOESTRIN FE 1 MG-10 MCG / 10 MCG PO TABS: 1.0000 | ORAL_TABLET | Freq: Every day | ORAL | 4 refills | Status: DC

## 2021-04-15 NOTE — Progress Notes (Signed)
Patient ID: [name], female   DOB: 07/10/1991, 30 y.o.   MRN: [id number] History of Present Illness: [place] is a 30 yea rold black female, single, G0P0, in for well woman gyn exam and pap and talk birth control. PCP is Dr Lodema Hong.   Current Medications, Allergies, Past Medical History, Past Surgical History, Family History and Social History were reviewed in [employer] record.     Review of Systems: Patient denies any headaches, hearing loss, fatigue, blurred vision, shortness of breath, chest pain, abdominal pain, problems with bowel movements, urination, or intercourse. No joint pain or mood swings. Periods are heavy at times and has cramps Had superficial thrombophlebitis of left calf in 2019, no DVT  Physical Exam:BP 120/81 (BP Location: Right Arm, Patient Position: Sitting, Cuff Size: Normal)   Pulse 89   Ht 5\' 4"  (1.626 m)   Wt 118 lb (53.5 kg)   LMP 03/28/2021 (Exact Date)   BMI 20.25 kg/m  General:  Well developed, well nourished, no acute distress Skin:  Warm and dry Neck:  Midline trachea, normal thyroid, good ROM, no lymphadenopathy Lungs; Clear to auscultation bilaterally Breast:  No dominant palpable mass, retraction, or nipple discharge Cardiovascular: Regular rate and rhythm Abdomen:  Soft, non tender, no hepatosplenomegaly Pelvic:  External genitalia is normal in appearance, has epidermal cysts both labia.  The vagina is normal in appearance. Urethra has no lesions or masses. The cervix is smooth and deviated to the right. Pap with GC/CHL and HR HPV genotyping performed. Uterus is felt to be normal size, shape, and contour.  No adnexal masses or tenderness noted.Bladder is non tender, no masses felt. Extremities/musculoskeletal:  No swelling or varicosities noted, no clubbing or cyanosis Psych:  No mood changes, alert and cooperative,seems happy AA is 1 Fall risk is low PHQ 9 score is 0 GAD 7 score is 4  Upstream - 04/15/21 1544       Pregnancy Intention Screening   Does the patient want to become pregnant in the next year? No    Does the patient's partner want to become pregnant in the next year? No    Would the patient like to discuss contraceptive options today? Yes      Contraception Wrap Up   Current Method Female Condom    End Method Oral Contraceptive    Contraception Counseling Provided Yes         Discussed COC with Dr 04/17/21 and he says that is fine Examination chaperoned by Despina Hidden RN   Impression and Plan: 1. Encounter for gynecological examination with Papanicolaou smear of cervix Pap sent Physical in 1 year Pap in 3 if normal   2. Encounter for initial prescription of contraceptive pills Gave 3 packs of Lo Loestrin Meds ordered this encounter  Medications  . Norethindrone-Ethinyl Estradiol-Fe Biphas (LO LOESTRIN FE) 1 MG-10 MCG / 10 MCG tablet    Sig: Take 1 tablet by mouth daily. Take 1 daily by mouth    Dispense:  84 tablet    Refill:  4    BIN Lawanna Kobus, PCN CN, GRP [id number]    Order Specific Question:   Supervising Provider    Answer:   [id number]]    3. History of superficial phlebitis If any pain in calves go to ER

## 2021-04-17 ENCOUNTER — Telehealth: Payer: Self-pay | Admitting: *Deleted

## 2021-04-17 NOTE — Telephone Encounter (Signed)
Pt called to schedule PRN follow up appt. Pt complaining of pain similar to the last time she had a blood clot in her left leg. She has no swelling. Scheduled appt.

## 2021-04-18 LAB — CYTOLOGY - PAP
Chlamydia: NEGATIVE
Comment: NEGATIVE
Comment: NEGATIVE
Comment: NORMAL
Diagnosis: NEGATIVE
Diagnosis: REACTIVE
High risk HPV: NEGATIVE
Neisseria Gonorrhea: NEGATIVE

## 2021-04-23 ENCOUNTER — Ambulatory Visit: Payer: BC Managed Care – PPO | Admitting: Allergy

## 2021-05-02 ENCOUNTER — Other Ambulatory Visit: Payer: Self-pay | Admitting: *Deleted

## 2021-05-02 DIAGNOSIS — I809 Phlebitis and thrombophlebitis of unspecified site: Secondary | ICD-10-CM

## 2021-05-12 DIAGNOSIS — L658 Other specified nonscarring hair loss: Secondary | ICD-10-CM | POA: Diagnosis not present

## 2021-05-12 DIAGNOSIS — L089 Local infection of the skin and subcutaneous tissue, unspecified: Secondary | ICD-10-CM | POA: Diagnosis not present

## 2021-05-12 DIAGNOSIS — L700 Acne vulgaris: Secondary | ICD-10-CM | POA: Diagnosis not present

## 2021-05-13 ENCOUNTER — Ambulatory Visit: Payer: BC Managed Care – PPO | Admitting: Vascular Surgery

## 2021-05-13 ENCOUNTER — Encounter: Payer: Self-pay | Admitting: Vascular Surgery

## 2021-05-13 ENCOUNTER — Ambulatory Visit (HOSPITAL_COMMUNITY)
Admission: RE | Admit: 2021-05-13 | Discharge: 2021-05-13 | Disposition: A | Discharge status: Home / Self Care | Payer: BC Managed Care – PPO | Source: Ambulatory Visit | Attending: Vascular Surgery | Admitting: Vascular Surgery

## 2021-05-13 ENCOUNTER — Other Ambulatory Visit: Payer: Self-pay

## 2021-05-13 ENCOUNTER — Encounter (HOSPITAL_COMMUNITY): Payer: BC Managed Care – PPO

## 2021-05-13 VITALS — BP 118/73 | HR 72 | Temp 98.8°F | Resp 20 | Ht 64.0 in | Wt 118.0 lb

## 2021-05-13 DIAGNOSIS — I809 Phlebitis and thrombophlebitis of unspecified site: Secondary | ICD-10-CM

## 2021-05-13 NOTE — Progress Notes (Signed)
Patient ID: Kelly [name], female   DOB: 06-08-1991, 30 y.o.   MRN: [id number]  Reason for Consult: Follow-up   Referred by Kelly Perches, MD  Subjective:     HPI:  Kelly [name] is a 30 y.o. 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  She denies any history of DVT.  She does have a grandmother with multiple DVTs.  She was taking estrogen-based hormonal therapy she had switched to progesterone-based.  She is now back on the estrogen-based.  She does have some pain in her left leg that comes on spontaneously last for a few minutes at a time.  She has not noticed any exacerbating or relieving factors.  She does not have any swelling in the leg.  She is not had any point tenderness.  She is here today for evaluation of DVT.  Past Medical History:  Diagnosis Date  . Allergy    penicillin  . Vertigo    Family History  Problem Relation Age of Onset  . Hypertension Mother   . Hypertension Father   . Hypertension Sister   . Hypertension Maternal Grandmother   . Hypertension Maternal Grandfather   . Hypertension Paternal Grandmother   . Hypertension Paternal Grandfather    Past Surgical History:  Procedure Laterality Date  . ADENOIDECTOMY    . TONSILLECTOMY     under 12     Short Social History:  Social History   Tobacco Use  . Smoking status: Never Smoker  . Smokeless tobacco: Never Used  Substance Use Topics  . Alcohol use: No    Allergies  Allergen Reactions  . Penicillins Rash    Current Outpatient Medications  Medication Sig Dispense Refill  . Clindamycin-Benzoyl Per, Refr, gel Apply topically daily.    . clobetasol (TEMOVATE) 0.05 % external solution Apply topically.    . ergocalciferol (VITAMIN D2) 1.25 MG (50000 UT) capsule Take 1 capsule (50,000 Units total) by mouth once a week. One capsule once weekly 12 capsule 1  . montelukast (SINGULAIR) 10 MG tablet Take 1 tablet (10 mg total) by mouth at bedtime.  30 tablet 5  . Norethindrone-Ethinyl Estradiol-Fe Biphas (LO LOESTRIN FE) 1 MG-10 MCG / 10 MCG tablet Take 1 tablet by mouth daily. Take 1 daily by mouth 84 tablet 4  . Olopatadine HCl 0.2 % SOLN Apply 1 drop to eye daily as needed (itchy/watery eyes). 2.5 mL 5   No current facility-administered medications for this visit.    Review of Systems  Constitutional:  Constitutional negative. HENT: HENT negative.  Eyes: Eyes negative.  Respiratory: Respiratory negative.  Cardiovascular: Cardiovascular negative.  GI: Gastrointestinal negative.  Musculoskeletal: Positive for leg pain.  Neurological: Neurological negative. Hematologic: Hematologic/lymphatic negative.  Psychiatric: Psychiatric negative.        Objective:  Objective  Vitals:   05/13/21 1538  BP: 118/73  Pulse: 72  Resp: 20  Temp: 98.8 F (37.1 C)  SpO2: 99%    Physical Exam HENT:     Head: Normocephalic.     Nose:     Comments: Wearing a mask Eyes:     Pupils: Pupils are equal, round, and reactive to light.  Cardiovascular:     Rate and Rhythm: Normal rate.  Pulmonary:     Effort: Pulmonary effort is normal.  Abdominal:     General: Abdomen is flat.  Musculoskeletal:        General: No swelling. Normal range of motion.  Cervical back: Normal range of motion.     Right lower leg: No edema.     Left lower leg: No edema.  Skin:    General: Skin is warm.     Capillary Refill: Capillary refill takes less than 2 seconds.     Comments: No abnormalities noted left posterior calf  Neurological:     General: No focal deficit present.     Mental Status: She is alert.  Psychiatric:        Mood and Affect: Mood normal.        Behavior: Behavior normal.        Thought Content: Thought content normal.        Judgment: Judgment normal.     Data: Summary:  RIGHT:  - No evidence of common femoral vein obstruction.    LEFT:  - Findings suggest resolution of previously noted superficial thrombus.  - There  is no evidence of deep vein thrombosis in the lower extremity.  - There is no evidence of superficial venous thrombosis.    - No cystic structure found in the popliteal fossa.        Assessment/Plan:     30 year old female with history of thrombophlebitis left posterior calf varicosity.  There is no abnormality grossly on physical exam no evidence of DVT on ultrasound today.  She is switch back to estrogen-based therapy per her gynecologist.  I discussed with her the signs and symptoms of DVT but that she does not have any signs at this time.  She has previously had reflux studies which were normal as well.  I would not recommend any intervention for her at this time.  I did discuss possible aspirin therapy but I think at 30 years old this seems a little too much for 1 episode of thrombophlebitis.  She may benefit from hereditary hypercoagulable work-up.  She can follow-up with me on an as-needed basis.     Kelly Harman MD [facility]

## 2021-05-16 DIAGNOSIS — R0602 Shortness of breath: Secondary | ICD-10-CM | POA: Diagnosis not present

## 2021-05-16 DIAGNOSIS — R0989 Other specified symptoms and signs involving the circulatory and respiratory systems: Secondary | ICD-10-CM | POA: Diagnosis not present

## 2021-05-16 DIAGNOSIS — Z20822 Contact with and (suspected) exposure to covid-19: Secondary | ICD-10-CM | POA: Diagnosis not present

## 2021-05-16 DIAGNOSIS — R002 Palpitations: Secondary | ICD-10-CM | POA: Diagnosis not present

## 2021-05-19 ENCOUNTER — Encounter: Payer: Self-pay | Admitting: Family Medicine

## 2021-05-21 ENCOUNTER — Other Ambulatory Visit: Payer: Self-pay

## 2021-05-21 ENCOUNTER — Ambulatory Visit: Payer: BC Managed Care – PPO | Admitting: Internal Medicine

## 2021-05-21 ENCOUNTER — Encounter: Payer: Self-pay | Admitting: Internal Medicine

## 2021-05-21 VITALS — BP 128/80 | HR 73 | Temp 99.0°F | Resp 18 | Ht 64.0 in | Wt 119.8 lb

## 2021-05-21 DIAGNOSIS — F418 Other specified anxiety disorders: Secondary | ICD-10-CM | POA: Insufficient documentation

## 2021-05-21 HISTORY — DX: Other specified anxiety disorders: F41.8

## 2021-05-21 MED ORDER — PROPRANOLOL HCL 10 MG PO TABS
10.0000 mg | ORAL_TABLET | Freq: Two times a day (BID) | ORAL | 1 refills | Status: DC | PRN
Start: 2021-05-21 — End: 2021-06-25

## 2021-05-21 NOTE — Patient Instructions (Addendum)
Please take Propranolol as needed for anxiety. Ideally, take 30-60 mins prior to anxiety provoking situation, i.e., meeting, presentation, etc.  Managing Anxiety, Adult After being diagnosed with an anxiety disorder, you may be relieved to know why you have felt or behaved a certain way. You may also feel overwhelmed about the treatment ahead and what it will mean for your life. With care and support, you can manage this condition and recover from it. How to manage lifestyle changes Managing stress and anxiety Stress is your body's reaction to life changes and events, both good and bad. Most stress will last just a few hours, but stress can be ongoing and can lead to more than just stress. Although stress can play a major role in anxiety, it is not the same as anxiety. Stress is usually caused by something external, such as a deadline, test, or competition. Stress normally passes after the triggering event has ended.  Anxiety is caused by something internal, such as imagining a terrible outcome or worrying that something will go wrong that will devastate you. Anxiety often does not go away even after the triggering event is over, and it can become long-term (chronic) worry. It is important to understand the differences between stress and anxiety and to manage your stress effectively so that it does not lead to an anxious response. Talk with your health care provider or a counselor to learn more about reducing anxiety and stress. He or she may suggest tension reduction techniques, such as:  Music therapy. This can include creating or listening to music that you enjoy and that inspires you.  Mindfulness-based meditation. This involves being aware of your normal breaths while not trying to control your breathing. It can be done while sitting or walking.  Centering prayer. This involves focusing on a word, phrase, or sacred image that means something to you and brings you peace.  Deep breathing. To do  this, expand your stomach and inhale slowly through your nose. Hold your breath for 3-5 seconds. Then exhale slowly, letting your stomach muscles relax.  Self-talk. This involves identifying thought patterns that lead to anxiety reactions and changing those patterns.  Muscle relaxation. This involves tensing muscles and then relaxing them. Choose a tension reduction technique that suits your lifestyle and personality. These techniques take time and practice. Set aside 5-15 minutes a day to do them. Therapists can offer counseling and training in these techniques. The training to help with anxiety may be covered by some insurance plans. Other things you can do to manage stress and anxiety include:  Keeping a stress/anxiety diary. This can help you learn what triggers your reaction and then learn ways to manage your response.  Thinking about how you react to certain situations. You may not be able to control everything, but you can control your response.  Making time for activities that help you relax and not feeling guilty about spending your time in this way.  Visual imagery and yoga can help you stay calm and relax.   Medicines Medicines can help ease symptoms. Medicines for anxiety include:  Anti-anxiety drugs.  Antidepressants. Medicines are often used as a primary treatment for anxiety disorder. Medicines will be prescribed by a health care provider. When used together, medicines, psychotherapy, and tension reduction techniques may be the most effective treatment. Relationships Relationships can play a big part in helping you recover. Try to spend more time connecting with trusted friends and family members. Consider going to couples counseling, taking family education classes,  or going to family therapy. Therapy can help you and others better understand your condition. How to recognize changes in your anxiety Everyone responds differently to treatment for anxiety. Recovery from anxiety  happens when symptoms decrease and stop interfering with your daily activities at home or work. This may mean that you will start to:  Have better concentration and focus. Worry will interfere less in your daily thinking.  Sleep better.  Be less irritable.  Have more energy.  Have improved memory. It is important to recognize when your condition is getting worse. Contact your health care provider if your symptoms interfere with home or work and you feel like your condition is not improving. Follow these instructions at home: Activity  Exercise. Most adults should do the following: ? Exercise for at least 150 minutes each week. The exercise should increase your heart rate and make you sweat (moderate-intensity exercise). ? Strengthening exercises at least twice a week.  Get the right amount and quality of sleep. Most adults need 7-9 hours of sleep each night. Lifestyle  Eat a healthy diet that includes plenty of vegetables, fruits, whole grains, low-fat dairy products, and lean protein. Do not eat a lot of foods that are high in solid fats, added sugars, or salt.  Make choices that simplify your life.  Do not use any products that contain nicotine or tobacco, such as cigarettes, e-cigarettes, and chewing tobacco. If you need help quitting, ask your health care provider.  Avoid caffeine, alcohol, and certain over-the-counter cold medicines. These may make you feel worse. Ask your pharmacist which medicines to avoid.   General instructions  Take over-the-counter and prescription medicines only as told by your health care provider.  Keep all follow-up visits as told by your health care provider. This is important. Where to find support You can get help and support from these sources:  Self-help groups.  Online and [employer].  A trusted spiritual leader.  Couples counseling.  Family education classes.  Family therapy. Where to find more information You may  find that joining a support group helps you deal with your anxiety. The following sources can help you locate counselors or support groups near you:  Mental Health America: www.mentalhealthamerica.net  Anxiety and Depression Association of [place] (ADAA): ProgramCam.de  [employer] on Mental Illness (NAMI): www.nami.org Contact a health care provider if you:  Have a hard time staying focused or finishing daily tasks.  Spend many hours a day feeling worried about everyday life.  Become exhausted by worry.  Start to have headaches, feel tense, or have nausea.  Urinate more than normal.  Have diarrhea. Get help right away if you have:  A racing heart and shortness of breath.  Thoughts of hurting yourself or others. If you ever feel like you may hurt yourself or others, or have thoughts about taking your own life, get help right away. You can go to your nearest emergency department or call:  Your local emergency services (911 in the [place]  A suicide crisis helpline, such as the National Suicide Prevention Lifeline at 609-501-8328. This is open 24 hours a day. Summary  Taking steps to learn and use tension reduction techniques can help calm you and help prevent triggering an anxiety reaction.  When used together, medicines, psychotherapy, and tension reduction techniques may be the most effective treatment.  Family, friends, and partners can play a big part in helping you recover from an anxiety disorder. This information is not intended to replace advice given  to you by your health care provider. Make sure you discuss any questions you have with your health care provider. Document Revised: 05/16/2019 Document Reviewed: 05/16/2019 Elsevier Patient Education  2021 [employer].

## 2021-05-21 NOTE — Assessment & Plan Note (Signed)
Palpitations and intermittent dyspnea could be related to situational anxiety and/or stress at work Relaxation techniques Propranolol 10 mg PRN EKG in Urgent care unremarkable If persistent symptoms, will repeat EKG and/or Cardiology referral for cardiac monitor

## 2021-05-21 NOTE — Progress Notes (Signed)
Acute Office Visit  Subjective:    Patient ID: Kelly [name], female    DOB: 05-10-1991, 30 y.o.   MRN: [id number]  Chief Complaint  Patient presents with  . Follow-up    Follow up from urgent care was seen for palpatations they did covid test and ekg both were good thinking anxiety this started last Friday no palpatations since Sunday however today she feels like its hard to catch her breath at times     HPI Patient is in today for evaluation of palpitations that started about 5 days ago. She went to Urgent care in [facility] for evaluation. EKG was unremarkable in the Urgent care. She also reported dyspnea at that time. She was told that it was related to anxiety. She states that palpitations have resolved now, but she still has mild dyspnea at times. Denies any cough or wheezing. She reports that her job is stressful and she recently had an assignment deadline at her work. She has to give presentations every week at her job. She denies any panic episodes while at work, although she works from home. Denies anhedonia, SI or HI. Her BP was wnl. She denies any headache or dizziness currently.  Past Medical History:  Diagnosis Date  . Allergy    penicillin  . Situational anxiety 05/21/2021  . Vertigo     Past Surgical History:  Procedure Laterality Date  . ADENOIDECTOMY    . TONSILLECTOMY     under 12     Family History  Problem Relation Age of Onset  . Hypertension Mother   . Hypertension Father   . Hypertension Sister   . Hypertension Maternal Grandmother   . Hypertension Maternal Grandfather   . Hypertension Paternal Grandmother   . Hypertension Paternal Grandfather     Social History   Socioeconomic History  . Marital status: Single    Spouse name: Not on file  . Number of children: 0  . Years of education: 32  . Highest education level: Not on file  Occupational History  . Occupation: HR director    Comment: [employer]  Tobacco Use  . Smoking status: Never  Smoker  . Smokeless tobacco: Never Used  Vaping Use  . Vaping Use: Never used  Substance and Sexual Activity  . Alcohol use: No  . Drug use: No  . Sexual activity: Yes    Birth control/protection: Condom  Other Topics Concern  . Not on file  Social History Narrative   Graduated   Occupational hygienist for [employer]   Lives in [place] - lives alone   Social Determinants of Health   Financial Resource Strain: Low Risk   . Difficulty of Paying Living Expenses: Not hard at all  Food Insecurity: No Food Insecurity  . Worried About Programme researcher, broadcasting/film/video in the Last Year: Never true  . Ran Out of Food in the Last Year: Never true  Transportation Needs: No Transportation Needs  . Lack of Transportation (Medical): No  . Lack of Transportation (Non-Medical): No  Physical Activity: Insufficiently Active  . Days of Exercise per Week: 2 days  . Minutes of Exercise per Session: 60 min  Stress: No Stress Concern Present  . Feeling of Stress : Only a little  Social Connections: Moderately Isolated  . Frequency of Communication with Friends and Family: More than three times a week  . Frequency of Social Gatherings with Friends and Family: More than three times a week  . Attends Religious Services:  More than 4 times per year  . Active Member of Clubs or Organizations: No  . Attends Banker Meetings: Patient refused  . Marital Status: Never married  Intimate Partner Violence: Not At Risk  . Fear of Current or Ex-Partner: No  . Emotionally Abused: No  . Physically Abused: No  . Sexually Abused: No    Outpatient Medications Prior to Visit  Medication Sig Dispense Refill  . Clindamycin-Benzoyl Per, Refr, gel Apply topically daily.    . clobetasol (TEMOVATE) 0.05 % external solution Apply topically.    . ergocalciferol (VITAMIN D2) 1.25 MG (50000 UT) capsule Take 1 capsule (50,000 Units total) by mouth once a week. One capsule once weekly 12 capsule 1  . montelukast (SINGULAIR) 10  MG tablet Take 1 tablet (10 mg total) by mouth at bedtime. 30 tablet 5  . Norethindrone-Ethinyl Estradiol-Fe Biphas (LO LOESTRIN FE) 1 MG-10 MCG / 10 MCG tablet Take 1 tablet by mouth daily. Take 1 daily by mouth 84 tablet 4  . Olopatadine HCl 0.2 % SOLN Apply 1 drop to eye daily as needed (itchy/watery eyes). (Patient not taking: Reported on 05/21/2021) 2.5 mL 5   No facility-administered medications prior to visit.    Allergies  Allergen Reactions  . Penicillins Rash    Review of Systems  Constitutional: Negative for chills and fever.  HENT: Negative for congestion, sinus pressure, sinus pain and sore throat.   Eyes: Negative for pain and discharge.  Respiratory: Positive for shortness of breath. Negative for cough.   Cardiovascular: Positive for palpitations. Negative for chest pain.  Gastrointestinal: Negative for abdominal pain, constipation, diarrhea, nausea and vomiting.  Endocrine: Negative for polydipsia and polyuria.  Genitourinary: Negative for dysuria and hematuria.  Musculoskeletal: Negative for neck pain and neck stiffness.  Skin: Negative for rash.  Neurological: Negative for dizziness and weakness.  Psychiatric/Behavioral: Negative for agitation and behavioral problems.       Objective:    Physical Exam Vitals reviewed.  Constitutional:      General: She is not in acute distress.    Appearance: She is not diaphoretic.  HENT:     Head: Normocephalic and atraumatic.     Nose: Nose normal.     Mouth/Throat:     Mouth: Mucous membranes are moist.  Eyes:     General: No scleral icterus.    Extraocular Movements: Extraocular movements intact.  Cardiovascular:     Rate and Rhythm: Normal rate and regular rhythm.     Pulses: Normal pulses.     Heart sounds: Normal heart sounds. No murmur heard.   Pulmonary:     Breath sounds: Normal breath sounds. No wheezing or rales.  Musculoskeletal:     Cervical back: Neck supple. No tenderness.     Right lower leg: No  edema.     Left lower leg: No edema.  Skin:    General: Skin is warm.     Findings: No rash.  Neurological:     General: No focal deficit present.     Mental Status: She is alert and oriented to person, place, and time.  Psychiatric:        Mood and Affect: Mood normal.        Behavior: Behavior normal.     BP 128/80 (BP Location: Left Arm, Patient Position: Sitting, Cuff Size: Normal)   Pulse 73   Temp 99 F (37.2 C) (Oral)   Resp 18   Ht 5\' 4"  (1.626 m)   Wt  119 lb 12.8 oz (54.3 kg)   SpO2 98%   BMI 20.56 kg/m  Wt Readings from Last 3 Encounters:  05/21/21 119 lb 12.8 oz (54.3 kg)  05/13/21 118 lb (53.5 kg)  04/15/21 118 lb (53.5 kg)    Health Maintenance Due  Topic Date Due  . COVID-19 Vaccine (3 - Booster for Moderna series) 09/08/2020    There are no preventive care reminders to display for this patient.   Lab Results  Component Value Date   TSH 1.470 03/11/2021   Lab Results  Component Value Date   WBC 4.8 03/11/2021   HGB 13.5 03/11/2021   HCT 41.1 03/11/2021   MCV 94 03/11/2021   PLT 311 03/11/2021   Lab Results  Component Value Date   NA 137 03/11/2021   K 4.0 03/11/2021   CO2 21 03/11/2021   GLUCOSE 85 03/11/2021   BUN 6 03/11/2021   CREATININE 0.71 03/11/2021   BILITOT 0.6 03/11/2021   ALKPHOS 40 (L) 03/11/2021   AST 22 03/11/2021   ALT 9 03/11/2021   PROT 8.3 03/11/2021   ALBUMIN 4.9 03/11/2021   CALCIUM 9.7 03/11/2021   ANIONGAP 7 12/15/2018   EGFR 119 03/11/2021   Lab Results  Component Value Date   CHOL 215 (H) 03/11/2021   Lab Results  Component Value Date   HDL 84 03/11/2021   Lab Results  Component Value Date   LDLCALC 119 (H) 03/11/2021   Lab Results  Component Value Date   TRIG 66 03/11/2021   Lab Results  Component Value Date   CHOLHDL 2.6 03/11/2021   No results found for: HGBA1C     Assessment & Plan:   Problem List Items Addressed This Visit      Other   Situational anxiety - Primary     Palpitations and intermittent dyspnea could be related to situational anxiety and/or stress at work Relaxation techniques Propranolol 10 mg PRN EKG in Urgent care unremarkable If persistent symptoms, will repeat EKG and/or Cardiology referral for cardiac monitor      Relevant Medications   propranolol (INDERAL) 10 MG tablet       Meds ordered this encounter  Medications  . propranolol (INDERAL) 10 MG tablet    Sig: Take 1 tablet (10 mg total) by mouth 2 (two) times daily as needed (Anxiety/stress).    Dispense:  30 tablet    Refill:  1     Gayleen Sholtz Concha Se, MD

## 2021-05-22 ENCOUNTER — Emergency Department (HOSPITAL_BASED_OUTPATIENT_CLINIC_OR_DEPARTMENT_OTHER)
Admission: EM | Admit: 2021-05-22 | Discharge: 2021-05-22 | Disposition: A | Discharge status: Home / Self Care | Payer: BC Managed Care – PPO | Attending: Emergency Medicine | Admitting: Emergency Medicine

## 2021-05-22 ENCOUNTER — Encounter (HOSPITAL_BASED_OUTPATIENT_CLINIC_OR_DEPARTMENT_OTHER): Payer: Self-pay | Admitting: Urology

## 2021-05-22 ENCOUNTER — Encounter: Payer: Self-pay | Admitting: Family Medicine

## 2021-05-22 ENCOUNTER — Other Ambulatory Visit: Payer: Self-pay

## 2021-05-22 ENCOUNTER — Emergency Department (HOSPITAL_BASED_OUTPATIENT_CLINIC_OR_DEPARTMENT_OTHER): Payer: BC Managed Care – PPO

## 2021-05-22 ENCOUNTER — Telehealth (INDEPENDENT_AMBULATORY_CARE_PROVIDER_SITE_OTHER): Payer: BC Managed Care – PPO | Admitting: Family Medicine

## 2021-05-22 VITALS — Ht 64.0 in | Wt 119.0 lb

## 2021-05-22 DIAGNOSIS — R079 Chest pain, unspecified: Secondary | ICD-10-CM | POA: Diagnosis not present

## 2021-05-22 DIAGNOSIS — R42 Dizziness and giddiness: Secondary | ICD-10-CM | POA: Diagnosis not present

## 2021-05-22 DIAGNOSIS — R0602 Shortness of breath: Secondary | ICD-10-CM | POA: Diagnosis not present

## 2021-05-22 DIAGNOSIS — R0789 Other chest pain: Secondary | ICD-10-CM | POA: Insufficient documentation

## 2021-05-22 DIAGNOSIS — R002 Palpitations: Secondary | ICD-10-CM | POA: Insufficient documentation

## 2021-05-22 LAB — COMPREHENSIVE METABOLIC PANEL
ALT: 10 U/L (ref 0–44)
AST: 17 U/L (ref 15–41)
Albumin: 4.1 g/dL (ref 3.5–5.0)
Alkaline Phosphatase: 31 U/L — ABNORMAL LOW (ref 38–126)
Anion gap: 8 (ref 5–15)
BUN: 11 mg/dL (ref 6–20)
CO2: 24 mmol/L (ref 22–32)
Calcium: 8.8 mg/dL — ABNORMAL LOW (ref 8.9–10.3)
Chloride: 103 mmol/L (ref 98–111)
Creatinine, Ser: 0.69 mg/dL (ref 0.44–1.00)
GFR, Estimated: >60 mL/min (ref >60)
Glucose, Bld: 98 mg/dL (ref 70–99)
Potassium: 4.1 mmol/L (ref 3.5–5.1)
Sodium: 135 mmol/L (ref 135–145)
Total Bilirubin: 0.5 mg/dL (ref 0.3–1.2)
Total Protein: 8.3 g/dL — ABNORMAL HIGH (ref 6.5–8.1)

## 2021-05-22 LAB — CBC WITH DIFFERENTIAL/PLATELET
Abs Immature Granulocytes: 0.01 10*3/uL (ref 0.00–0.07)
Basophils Absolute: 0.1 10*3/uL (ref 0.0–0.1)
Basophils Relative: 1 %
Eosinophils Absolute: 0.1 10*3/uL (ref 0.0–0.5)
Eosinophils Relative: 1 %
HCT: 38.5 % (ref 36.0–46.0)
Hemoglobin: 13.4 g/dL (ref 12.0–15.0)
Immature Granulocytes: 0 %
Lymphocytes Relative: 46 %
Lymphs Abs: 2.8 10*3/uL (ref 0.7–4.0)
MCH: 32.8 pg (ref 26.0–34.0)
MCHC: 34.8 g/dL (ref 30.0–36.0)
MCV: 94.1 fL (ref 80.0–100.0)
Monocytes Absolute: 0.5 10*3/uL (ref 0.1–1.0)
Monocytes Relative: 7 %
Neutro Abs: 2.7 10*3/uL (ref 1.7–7.7)
Neutrophils Relative %: 45 %
Platelets: 335 10*3/uL (ref 150–400)
RBC: 4.09 MIL/uL (ref 3.87–5.11)
RDW: 11.8 % (ref 11.5–15.5)
WBC: 6.1 10*3/uL (ref 4.0–10.5)
nRBC: 0 % (ref 0.0–0.2)

## 2021-05-22 LAB — TROPONIN I (HIGH SENSITIVITY): Troponin I (High Sensitivity): <2 ng/L (ref <18)

## 2021-05-22 LAB — D-DIMER, QUANTITATIVE: D-Dimer, Quant: 0.37 ug/mL-FEU (ref 0.00–0.50)

## 2021-05-22 MED ORDER — MECLIZINE HCL 12.5 MG PO TABS: 12.5000 mg | ORAL_TABLET | Freq: Three times a day (TID) | ORAL | 0 refills | Status: DC | PRN

## 2021-05-22 MED ORDER — SODIUM CHLORIDE 0.9 % IV BOLUS
1000.0000 mL | Freq: Once | INTRAVENOUS | Status: AC
  Administered 2021-05-22: 1000 mL via INTRAVENOUS

## 2021-05-22 NOTE — Progress Notes (Signed)
Virtual Visit via Telephone Note  I connected with Kelly [name] on 05/22/21 at  8:40 AM EDT by telephone and verified that I am speaking with the correct person using two identifiers.  Location: Patient: home  Provider: office   I discussed the limitations, risks, security and privacy concerns of performing an evaluation and management service by telephone and the availability of in person appointments. I also discussed with the patient that there may be a patient responsible charge related to this service. The patient expressed understanding and agreed to proceed.   History of Present Illness: Last week Friday started having sOB, and needed, to take deep breaths. when driving out for lunch started feeling faint and dizzy went to UC checked out oK, including normal eKG Started having palpitations  From Friday through Monday, max duration up to 5 mins Monday on avg 2 episodes / day. Notes feeling SOB since last Friday Had episode of dizziness and light headedness yesterday, lasted about 1.5 hours, took 1 propanolol and fell asleep Woke up at 2:30 this morning and had sOB and dizziness , has been awake since 2:30 am Yesterday had sharp left chest pain to back, duration 3 mins Denies symptoms of anxiety or panic Was under increased stress on the job last week which has resolved, but symptoms persist.  Observations/Objective: Ht 5\' 4"  (1.626 m)   Wt 119 lb (54 kg)   LMP 04/27/2021   BMI 20.43 kg/m   Good communication with no confusion and intact memory. Alert and oriented x 3 No signs of respiratory distress during speech   Assessment and Plan:  Light headedness 1 week h/o recurrent ight headedness, associated with new palpitations, and intermittent left chest pain. Obtain d dimer stat, refer to carddiology for further eval I advised if symptoms worsen go to the nearest ED  Vertigo Intermittent x 1 week Rx: meclizine 25 mg every 8 hrs as needed, short term   Follow Up  Instructions:    I discussed the assessment and treatment plan with the patient. The patient was provided an opportunity to ask questions and all were answered. The patient agreed with the plan and demonstrated an understanding of the instructions.   The patient was advised to call back or seek an in-person evaluation if the symptoms worsen or if the condition fails to improve as anticipated.  I provided 15 inutes of non-face-to-face time during this encounter.   06/27/2021, MD

## 2021-05-22 NOTE — ED Notes (Signed)
C/o SOB/Dizziness, since Friday.

## 2021-05-22 NOTE — Assessment & Plan Note (Signed)
1 week h/o recurrent ight headedness, associated with new palpitations, and intermittent left chest pain. Obtain d dimer stat, refer to carddiology for further eval I advised if symptoms worsen go to the nearest ED

## 2021-05-22 NOTE — Discharge Instructions (Signed)
Continue taking your propranolol as prescribed by your doctor.  Your work-up in the ED is very reassuring.  In particular your heart enzyme is normal and your D-dimer is normal.  Follow-up with your doctor  Return to ER if you have worse dizziness, chest pain, passing out, palpitations.

## 2021-05-22 NOTE — ED Triage Notes (Signed)
SOB since last Friday went to UC, negative covid, EKG normal, seen PCP yesterday, started propanolol for anxiety. Took at [employer] feels like "someone is suffocating me". Went today for D-dimer

## 2021-05-22 NOTE — Patient Instructions (Signed)
You need a d dimer done stat ( [facility] in [place]) today because of shortness of breath recurrent  I am referring you to Cardiology   Meclizine is prescribed for vertigo if needed  Office visit In 6 weeks, call if you need me before   Nurse please call pt at end of visit to co ordinate lab draw today

## 2021-05-22 NOTE — Assessment & Plan Note (Signed)
Intermittent x 1 week Rx: meclizine 25 mg every 8 hrs as needed, short term

## 2021-05-22 NOTE — ED Provider Notes (Signed)
[facility] EMERGENCY DEPARTMENT Provider Note   CSN: [id number] Arrival date & time: 05/22/21  1826     History Chief Complaint  Patient presents with  . Shortness of Breath    CATLYNN GRONDAHL is a 30 y.o. female hx of anxiety, vertigo here presenting with palpitations and chest pressure.  Patient states that she has been having dizziness and palpitations for several days.  She went to see her primary care doctor yesterday.  She was thought to have anxiety and was started on propranolol.  Her COVID test at that time was negative her EKG was unremarkable.  She had a video visit today for persistent symptoms and had outpatient D-dimer that was drawn but no results came back.  She has persistent dizziness and shortness of breath so came here for further evaluation.  Denies any fevers or chills.  The history is provided by the patient.       Past Medical History:  Diagnosis Date  . Allergy    penicillin  . Situational anxiety 05/21/2021  . Vertigo     Patient Active Problem List   Diagnosis Date Noted  . Palpitations 05/22/2021  . Light headedness 05/22/2021  . Vertigo 05/22/2021  . Situational anxiety 05/21/2021  . Encounter for initial prescription of contraceptive pills 04/15/2021  . History of superficial phlebitis 04/15/2021  . Other allergic rhinitis 01/16/2021  . Allergic conjunctivitis of both eyes 01/16/2021  . Dermatitis 01/16/2021  . Skin inflammation 07/09/2020  . 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 Acne vulgaris 04/20/2017    Past Surgical History:  Procedure Laterality Date  . ADENOIDECTOMY    . TONSILLECTOMY     under 10      OB History    Gravida  0   Para  0   Term  0   Preterm  0   AB  0   Living  0     SAB  0   IAB  0   Ectopic  0   Multiple  0   Live Births  0           Family History  Problem Relation Age of Onset  . Hypertension Mother   . Hypertension Father   . Hypertension Sister   . Hypertension  Maternal Grandmother   . Hypertension Maternal Grandfather   . Hypertension Paternal Grandmother   . Hypertension Paternal Grandfather     Social History   Tobacco Use  . Smoking status: Never Smoker  . Smokeless tobacco: Never Used  Vaping Use  . Vaping Use: Never used  Substance Use Topics  . Alcohol use: No  . Drug use: No    Home Medications Prior to Admission medications   Medication Sig Start Date End Date Taking? Authorizing Provider  clobetasol (TEMOVATE) 0.05 % external solution Apply topically. 12/10/20   [provider]  ergocalciferol (VITAMIN D2) 1.25 MG (50000 UT) capsule Take 1 capsule (50,000 Units total) by mouth once a week. One capsule once weekly 03/13/21   Kerri Perches, MD  meclizine (ANTIVERT) 12.5 MG tablet Take 1 tablet (12.5 mg total) by mouth 3 (three) times daily as needed for dizziness. 05/22/21   Kerri Perches, MD  montelukast (SINGULAIR) 10 MG tablet Take 1 tablet (10 mg total) by mouth at bedtime. 01/15/21   Ellamae Sia, DO  Norethindrone-Ethinyl Estradiol-Fe Biphas (LO LOESTRIN FE) 1 MG-10 MCG / 10 MCG tablet Take 1 tablet by mouth daily. Take 1 daily by mouth 04/15/21  Cyril Mourning A, NP  propranolol (INDERAL) 10 MG tablet Take 1 tablet (10 mg total) by mouth 2 (two) times daily as needed (Anxiety/stress). 05/21/21   Anabel Halon, MD    Allergies    Penicillins  Review of Systems   Review of Systems  Respiratory: Positive for shortness of breath.   Neurological: Positive for dizziness.  All other systems reviewed and are negative.   Physical Exam Updated Vital Signs BP 122/90   Pulse 67   Temp 98.3 F (36.8 C) (Oral)   Resp 12   Ht 5\' 4"  (1.626 m)   Wt 54 kg   LMP 04/27/2021   SpO2 100%   BMI 20.43 kg/m   Physical Exam Vitals and nursing note reviewed.  Constitutional:      Comments: Slightly anxious  HENT:     Head: Normocephalic.     Mouth/Throat:     Mouth: Mucous membranes are moist.  Eyes:      Extraocular Movements: Extraocular movements intact.     Pupils: Pupils are equal, round, and reactive to light.  Cardiovascular:     Rate and Rhythm: Normal rate and regular rhythm.  Pulmonary:     Effort: Pulmonary effort is normal.     Breath sounds: Normal breath sounds.  Abdominal:     General: Bowel sounds are normal.     Palpations: Abdomen is soft.  Musculoskeletal:        General: Normal range of motion.     Cervical back: Normal range of motion and neck supple.  Skin:    General: Skin is warm.     Capillary Refill: Capillary refill takes less than 2 seconds.  Neurological:     General: No focal deficit present.     Mental Status: She is oriented to person, place, and time.  Psychiatric:        Mood and Affect: Mood normal.        Behavior: Behavior normal.     ED Results / Procedures / Treatments   Labs (all labs ordered are listed, but only abnormal results are displayed) Labs Reviewed  COMPREHENSIVE METABOLIC PANEL - Abnormal; Notable for the following components:      Result Value   Calcium 8.8 (*)    Total Protein 8.3 (*)    Alkaline Phosphatase 31 (*)    All other components within normal limits  CBC WITH DIFFERENTIAL/PLATELET  D-DIMER, QUANTITATIVE  TROPONIN I (HIGH SENSITIVITY)    EKG EKG Interpretation  Date/Time:  Thursday May 22 2021 18:39:30 EDT Ventricular Rate:  69 PR Interval:  116 QRS Duration: 76 QT Interval:  374 QTC Calculation: 400 R Axis:   71 Text Interpretation: Normal sinus rhythm Normal ECG No significant change since last tracing Confirmed by 06-22[birth year]1982 [phone]) on 05/22/2021 7:19:45 PM   Radiology DG Chest Port 1 View  Result Date: 05/22/2021 CLINICAL DATA:  Chest pain and shortness of breath. EXAM: PORTABLE CHEST 1 VIEW COMPARISON:  None. FINDINGS: The cardiomediastinal contours are normal. The lungs are clear. Pulmonary vasculature is normal. No consolidation, pleural effusion, or pneumothorax. No acute osseous  abnormalities are seen. IMPRESSION: Negative AP view of the chest. Electronically Signed   By: 05/24/2021 M.D.   On: 05/22/2021 20:00    Procedures Procedures   Medications Ordered in ED Medications  sodium chloride 0.9 % bolus 1,000 mL (1,000 mLs Intravenous New Bag/Given 05/22/21 1943)    ED Course  I have reviewed the triage vital signs and  the nursing notes.  Pertinent labs & imaging results that were available during my care of the patient were reviewed by me and considered in my medical decision making (see chart for details).    MDM Rules/Calculators/A&P                         MELDA MERMELSTEIN is a 30 y.o. female who presented with dizziness and persistent shortness of breath and palpitations.  Patient's heart rate is normal.  Patient had outpatient D-dimer levels drawn but no results.  At this point I think she is low risk for PE so we will get a D-dimer in the ED.  We will also get CBC and CMP and troponin.  Will hydrate patient as well.  She had a COVID antigen test that was negative and had a low suspicion for COVID  8:48 PM Labs including D-dimer is negative.  Troponin negative x1 but symptoms for several days and have low suspicion for ACS.  I think most of her complaints may be related to anxiety.  She is under a lot of stress.  She is prescribed propranolol already and I told her to take it as prescribed by her doctor.  Stable for discharge   Final Clinical Impression(s) / ED Diagnoses Final diagnoses:  None    Rx / DC Orders ED Discharge Orders    None       Charlynne Pander, MD 05/22/21 2048

## 2021-05-23 ENCOUNTER — Encounter: Payer: Self-pay | Admitting: Family Medicine

## 2021-05-23 ENCOUNTER — Other Ambulatory Visit: Payer: Self-pay

## 2021-05-23 ENCOUNTER — Emergency Department (HOSPITAL_COMMUNITY): Payer: BC Managed Care – PPO

## 2021-05-23 ENCOUNTER — Emergency Department (HOSPITAL_COMMUNITY)
Admission: EM | Admit: 2021-05-23 | Discharge: 2021-05-24 | Disposition: A | Discharge status: Home / Self Care | Payer: BC Managed Care – PPO | Attending: Emergency Medicine | Admitting: Emergency Medicine

## 2021-05-23 ENCOUNTER — Encounter (HOSPITAL_COMMUNITY): Payer: Self-pay

## 2021-05-23 DIAGNOSIS — R0602 Shortness of breath: Secondary | ICD-10-CM | POA: Insufficient documentation

## 2021-05-23 DIAGNOSIS — R42 Dizziness and giddiness: Secondary | ICD-10-CM | POA: Insufficient documentation

## 2021-05-23 DIAGNOSIS — R21 Rash and other nonspecific skin eruption: Secondary | ICD-10-CM | POA: Diagnosis not present

## 2021-05-23 DIAGNOSIS — R002 Palpitations: Secondary | ICD-10-CM | POA: Diagnosis not present

## 2021-05-23 DIAGNOSIS — R079 Chest pain, unspecified: Secondary | ICD-10-CM | POA: Diagnosis not present

## 2021-05-23 DIAGNOSIS — R0789 Other chest pain: Secondary | ICD-10-CM | POA: Diagnosis not present

## 2021-05-23 LAB — BASIC METABOLIC PANEL
Anion gap: 6 (ref 5–15)
BUN: 11 mg/dL (ref 6–20)
CO2: 27 mmol/L (ref 22–32)
Calcium: 9.1 mg/dL (ref 8.9–10.3)
Chloride: 102 mmol/L (ref 98–111)
Creatinine, Ser: 0.76 mg/dL (ref 0.44–1.00)
GFR, Estimated: >60 mL/min (ref >60)
Glucose, Bld: 92 mg/dL (ref 70–99)
Potassium: 4.1 mmol/L (ref 3.5–5.1)
Sodium: 135 mmol/L (ref 135–145)

## 2021-05-23 LAB — CBC WITH DIFFERENTIAL/PLATELET
Abs Immature Granulocytes: 0 10*3/uL (ref 0.00–0.07)
Basophils Absolute: 0.1 10*3/uL (ref 0.0–0.1)
Basophils Relative: 1 %
Eosinophils Absolute: 0 10*3/uL (ref 0.0–0.5)
Eosinophils Relative: 1 %
HCT: 40.9 % (ref 36.0–46.0)
Hemoglobin: 13.6 g/dL (ref 12.0–15.0)
Immature Granulocytes: 0 %
Lymphocytes Relative: 51 %
Lymphs Abs: 2.4 10*3/uL (ref 0.7–4.0)
MCH: 32 pg (ref 26.0–34.0)
MCHC: 33.3 g/dL (ref 30.0–36.0)
MCV: 96.2 fL (ref 80.0–100.0)
Monocytes Absolute: 0.4 10*3/uL (ref 0.1–1.0)
Monocytes Relative: 8 %
Neutro Abs: 1.8 10*3/uL (ref 1.7–7.7)
Neutrophils Relative %: 39 %
Platelets: 304 10*3/uL (ref 150–400)
RBC: 4.25 MIL/uL (ref 3.87–5.11)
RDW: 11.9 % (ref 11.5–15.5)
WBC: 4.6 10*3/uL (ref 4.0–10.5)
nRBC: 0 % (ref 0.0–0.2)

## 2021-05-23 LAB — D-DIMER, QUANTITATIVE
D-DIMER: 0.56 mg/L FEU — ABNORMAL HIGH (ref 0.00–0.49)
D-Dimer, Quant: <0.27 ug/mL-FEU (ref 0.00–0.50)

## 2021-05-23 LAB — TROPONIN I (HIGH SENSITIVITY): Troponin I (High Sensitivity): 3 ng/L (ref <18)

## 2021-05-23 NOTE — ED Triage Notes (Signed)
Pt arrived via POV c/o SOB, chest pain and dizziness since last Friday. Pt reports going to Urgent Care last week and went and had blood work done yesterday which gave her 2 different D-Dimer levels. Pt reports Hx of blood clots and anxiety.

## 2021-05-23 NOTE — ED Provider Notes (Signed)
Emergency Medicine Provider Triage Evaluation Note  Kelly [name] , a 30 y.o. female  was evaluated in triage.  Pt complains of left sided pleuritic chest pain for the past week as well as SOB. Seen by PCP and had d dimer drawn which was elevated 0.56. hx of DVT last year related to OCP. She started OCPs back about 1 month ago. Is also complaining of left calf pain for the past month. No other complaints at this time.   Review of Systems  Positive: + chest pain, SOB, left calf pain Negative: - nausea, vomiting  Physical Exam  BP (!) 136/101 (BP Location: Right Arm)   Pulse 89   Temp 98.4 F (36.9 C) (Oral)   Resp 16   Ht 5\' 4"  (1.626 m)   Wt 54 kg   LMP 04/27/2021   SpO2 100%   BMI 20.43 kg/m  Gen:   Awake, no distress   Resp:  Normal effort  MSK:   Moves extremities without difficulty  Other:    Medical Decision Making  Medically screening exam initiated at 8:14 PM.  Appropriate orders placed.  SHAKORA NORDQUIST was informed that the remainder of the evaluation will be completed by another provider, this initial triage assessment does not replace that evaluation, and the importance of remaining in the ED until their evaluation is complete.     [name], PA-C 05/23/21 2015    05/25/21, MD 05/27/21 [phone]

## 2021-05-23 NOTE — ED Provider Notes (Signed)
[facility] EMERGENCY DEPARTMENT Provider Note   CSN: [id number] Arrival date & time: 05/23/21  1908     History Chief Complaint  Patient presents with  . Chest Pain    Kelly [name] is a 30 y.o. female.  Patient returns to the ED with intermittent left-sided chest pain with shortness of breath and dizziness for the past week.  She was seen for similar symptoms at [facility] emergency department yesterday as well as by her PCP twice.  States she is having worsening symptoms including chest pain worse with breathing as well as shortness of breath and dizziness.  Symptoms are intermittent and come and go.  She describes left-sided chest pain that is sharp lasting for about 5 minutes at a time that radiates to her left arm.  It is associate with some shortness of breath and palpitations.  Also with some dizziness described as lightheadedness.  No room spinning dizziness.  No focal weakness, numbness or tingling.  No cough or fever. Her PCP thought she could have anxiety and started her on propranolol.  She stopped her birth control.  She was sent to the ED yesterday for a D-dimer test which was negative.  She is had a superficial thrombus in the past but no history of DVT. She did have an outpatient D-dimer that was slightly elevated at 0.56 but has had 2 negative dimer since then. She has had a negative COVID test as well as a negative EKG.  She still having persistent dizziness with chest pain or shortness of breath and she wanted to be reevaluated. Denies any excessive caffeine use   The history is provided by the patient.  Chest Pain Associated symptoms: dizziness and shortness of breath   Associated symptoms: no abdominal pain, no fever, no headache, no nausea, no numbness, no vomiting and no weakness        Past Medical History:  Diagnosis Date  . Allergy    penicillin  . Situational anxiety 05/21/2021  . Vertigo     Patient Active Problem List   Diagnosis Date Noted  .  Palpitations 05/22/2021  . Light headedness 05/22/2021  . Vertigo 05/22/2021  . Situational anxiety 05/21/2021  . Encounter for initial prescription of contraceptive pills 04/15/2021  . History of superficial phlebitis 04/15/2021  . Other allergic rhinitis 01/16/2021  . Allergic conjunctivitis of both eyes 01/16/2021  . Dermatitis 01/16/2021  . Skin inflammation 07/09/2020  . 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 Acne vulgaris 04/20/2017    Past Surgical History:  Procedure Laterality Date  . ADENOIDECTOMY    . TONSILLECTOMY     under 10      OB History    Gravida  0   Para  0   Term  0   Preterm  0   AB  0   Living  0     SAB  0   IAB  0   Ectopic  0   Multiple  0   Live Births  0           Family History  Problem Relation Age of Onset  . Hypertension Mother   . Hypertension Father   . Hypertension Sister   . Hypertension Maternal Grandmother   . Hypertension Maternal Grandfather   . Hypertension Paternal Grandmother   . Hypertension Paternal Grandfather     Social History   Tobacco Use  . Smoking status: Never Smoker  . Smokeless tobacco: Never Used  Vaping Use  . Vaping  Use: Never used  Substance Use Topics  . Alcohol use: No  . Drug use: No    Home Medications Prior to Admission medications   Medication Sig Start Date End Date Taking? Authorizing Provider  clobetasol (TEMOVATE) 0.05 % external solution Apply topically. 12/10/20   [provider]  ergocalciferol (VITAMIN D2) 1.25 MG (50000 UT) capsule Take 1 capsule (50,000 Units total) by mouth once a week. One capsule once weekly 03/13/21   Kerri Perches, MD  meclizine (ANTIVERT) 12.5 MG tablet Take 1 tablet (12.5 mg total) by mouth 3 (three) times daily as needed for dizziness. 05/22/21   Kerri Perches, MD  montelukast (SINGULAIR) 10 MG tablet Take 1 tablet (10 mg total) by mouth at bedtime. 01/15/21   Ellamae Sia, DO  Norethindrone-Ethinyl Estradiol-Fe Biphas (LO  LOESTRIN FE) 1 MG-10 MCG / 10 MCG tablet Take 1 tablet by mouth daily. Take 1 daily by mouth 04/15/21   Adline Potter, NP  propranolol (INDERAL) 10 MG tablet Take 1 tablet (10 mg total) by mouth 2 (two) times daily as needed (Anxiety/stress). 05/21/21   Anabel Halon, MD    Allergies    Penicillins  Review of Systems   Review of Systems  Constitutional: Negative for activity change, appetite change and fever.  HENT: Negative for congestion and nosebleeds.   Respiratory: Positive for chest tightness and shortness of breath.   Cardiovascular: Positive for chest pain.  Gastrointestinal: Negative for abdominal pain, nausea and vomiting.  Genitourinary: Negative for dysuria and hematuria.  Musculoskeletal: Negative for arthralgias and myalgias.  Skin: Negative for rash.  Neurological: Positive for dizziness and light-headedness. Negative for tremors, weakness, numbness and headaches.   all other systems are negative except as noted in the HPI and PMH.    Physical Exam Updated Vital Signs BP (!) 136/101 (BP Location: Right Arm)   Pulse 89   Temp 98.4 F (36.9 C) (Oral)   Resp 16   Ht 5\' 4"  (1.626 m)   Wt 54 kg   LMP 04/27/2021   SpO2 100%   BMI 20.43 kg/m   Physical Exam Vitals and nursing note reviewed.  Constitutional:      General: She is not in acute distress.    Appearance: She is well-developed.     Comments: Anxious appearing  HENT:     Head: Normocephalic and atraumatic.     Mouth/Throat:     Pharynx: No oropharyngeal exudate.  Eyes:     Conjunctiva/sclera: Conjunctivae normal.     Pupils: Pupils are equal, round, and reactive to light.  Neck:     Comments: No meningismus. Cardiovascular:     Rate and Rhythm: Normal rate and regular rhythm.     Heart sounds: Normal heart sounds. No murmur heard.   Pulmonary:     Effort: Pulmonary effort is normal. No respiratory distress.     Breath sounds: Normal breath sounds.  Abdominal:     Palpations: Abdomen is  soft.     Tenderness: There is no abdominal tenderness. There is no guarding or rebound.  Musculoskeletal:        General: No tenderness. Normal range of motion.     Cervical back: Normal range of motion and neck supple.  Skin:    General: Skin is warm.  Neurological:     Mental Status: She is alert and oriented to person, place, and time.     Cranial Nerves: No cranial nerve deficit.     Motor: No  abnormal muscle tone.     Coordination: Coordination normal.     Comments:  5/5 strength throughout. CN 2-12 intact.Equal grip strength.   Psychiatric:        Behavior: Behavior normal.     ED Results / Procedures / Treatments   Labs (all labs ordered are listed, but only abnormal results are displayed) Labs Reviewed  URINALYSIS, ROUTINE W REFLEX MICROSCOPIC - Abnormal; Notable for the following components:      Result Value   APPearance HAZY (*)    Leukocytes,Ua TRACE (*)    Bacteria, UA RARE (*)    All other components within normal limits  CBC WITH DIFFERENTIAL/PLATELET  BASIC METABOLIC PANEL  D-DIMER, QUANTITATIVE  PREGNANCY, URINE  TSH  TROPONIN I (HIGH SENSITIVITY)  TROPONIN I (HIGH SENSITIVITY)    EKG EKG Interpretation  Date/Time:  Friday May 23 2021 23:52:10 EDT Ventricular Rate:  74 PR Interval:  125 QRS Duration: 92 QT Interval:  388 QTC Calculation: 431 R Axis:   73 Text Interpretation: Sinus rhythm No significant change was found Confirmed by Glynn Octave [phone]) on 05/24/2021 12:13:37 AM   Radiology DG Chest 2 View  Result Date: 05/23/2021 CLINICAL DATA:  30 year old female with chest pain. EXAM: CHEST - 2 VIEW COMPARISON:  Chest radiograph dated 05/22/2021. FINDINGS: The heart size and mediastinal contours are within normal limits. Both lungs are clear. The visualized skeletal structures are unremarkable. IMPRESSION: No active cardiopulmonary disease. Electronically Signed   By: Elgie Collard M.D.   On: 05/23/2021 20:35   DG Chest Port 1  View  Result Date: 05/22/2021 CLINICAL DATA:  Chest pain and shortness of breath. EXAM: PORTABLE CHEST 1 VIEW COMPARISON:  None. FINDINGS: The cardiomediastinal contours are normal. The lungs are clear. Pulmonary vasculature is normal. No consolidation, pleural effusion, or pneumothorax. No acute osseous abnormalities are seen. IMPRESSION: Negative AP view of the chest. Electronically Signed   By: Narda Rutherford M.D.   On: 05/22/2021 20:00    Procedures Procedures   Medications Ordered in ED Medications - No data to display  ED Course  I have reviewed the triage vital signs and the nursing notes.  Pertinent labs & imaging results that were available during my care of the patient were reviewed by me and considered in my medical decision making (see chart for details).    MDM Rules/Calculators/A&P                         Return visit with intermittent left-sided chest pain, shortness of breath, palpitations and dizziness.  On exam her heart rate is normal and EKG is sinus rhythm.  She is low risk for ACS or pulmonary embolism.  D-dimer is negative x2.  Chest x-ray is negative. No anemia. Will check orthostatics  Orthostatics are negative.  Troponin negative x2.  D-dimer is negative.  Low suspicion for ACS or pulmonary embolism. Anxiety may be playing a role to her presentation.  She is already on propranolol. TSH is normal.  Discussed reassuring work-up with patient.  She is tolerating p.o. and ambulatory. Discussed PCP follow-up and return precautions given.  Return to the ED with exertional chest pain, chest pain associate with shortness of breath, nausea, vomiting, sweating or any other concerns.  Final Clinical Impression(s) / ED Diagnoses Final diagnoses:  Palpitations  Atypical chest pain    Rx / DC Orders ED Discharge Orders    None       Santino Kinsella, Jeannett Senior, MD 05/24/21  0540  

## 2021-05-24 LAB — URINALYSIS, ROUTINE W REFLEX MICROSCOPIC
Bilirubin Urine: NEGATIVE
Glucose, UA: NEGATIVE mg/dL
Hgb urine dipstick: NEGATIVE
Ketones, ur: NEGATIVE mg/dL
Nitrite: NEGATIVE
Protein, ur: NEGATIVE mg/dL
Specific Gravity, Urine: 1.017 (ref 1.005–1.030)
pH: 6 (ref 5.0–8.0)

## 2021-05-24 LAB — PREGNANCY, URINE: Preg Test, Ur: NEGATIVE

## 2021-05-24 LAB — TROPONIN I (HIGH SENSITIVITY): Troponin I (High Sensitivity): 3 ng/L

## 2021-05-24 LAB — TSH: TSH: 2 u[IU]/mL (ref 0.350–4.500)

## 2021-05-24 NOTE — Discharge Instructions (Signed)
Your testing is reassuring.  There is no evidence of heart attack or blood clot in the lung. Take your medications as prescribed and follow-up with your doctor.  Return to the ED if you have exertional chest pain or chest pain associate with shortness of breath, nausea, vomiting, sweating, or other concerns.

## 2021-05-27 ENCOUNTER — Other Ambulatory Visit: Payer: Self-pay | Admitting: Family Medicine

## 2021-05-27 ENCOUNTER — Encounter: Payer: Self-pay | Admitting: Family Medicine

## 2021-05-27 ENCOUNTER — Ambulatory Visit (HOSPITAL_COMMUNITY)
Admission: RE | Admit: 2021-05-27 | Discharge: 2021-05-27 | Disposition: A | Discharge status: Home / Self Care | Payer: BC Managed Care – PPO | Source: Ambulatory Visit | Attending: Family Medicine | Admitting: Family Medicine

## 2021-05-27 ENCOUNTER — Other Ambulatory Visit: Payer: Self-pay

## 2021-05-27 ENCOUNTER — Ambulatory Visit: Payer: BC Managed Care – PPO | Admitting: Family Medicine

## 2021-05-27 ENCOUNTER — Telehealth: Payer: Self-pay

## 2021-05-27 VITALS — BP 121/86 | HR 75 | Temp 98.5°F | Resp 18 | Ht 64.0 in | Wt 119.8 lb

## 2021-05-27 DIAGNOSIS — H8113 Benign paroxysmal vertigo, bilateral: Secondary | ICD-10-CM | POA: Insufficient documentation

## 2021-05-27 DIAGNOSIS — R0602 Shortness of breath: Secondary | ICD-10-CM

## 2021-05-27 DIAGNOSIS — I809 Phlebitis and thrombophlebitis of unspecified site: Secondary | ICD-10-CM

## 2021-05-27 DIAGNOSIS — R42 Dizziness and giddiness: Secondary | ICD-10-CM

## 2021-05-27 DIAGNOSIS — F418 Other specified anxiety disorders: Secondary | ICD-10-CM

## 2021-05-27 MED ORDER — IOHEXOL 350 MG/ML SOLN
75.0000 mL | Freq: Once | INTRAVENOUS | Status: AC | PRN
  Administered 2021-05-27: 75 mL via INTRAVENOUS

## 2021-05-27 NOTE — Telephone Encounter (Signed)
Pls let her know that since she cannot see ENT for several months am referring her to PT for management of her vertigo

## 2021-05-27 NOTE — Patient Instructions (Signed)
F/U IN 4 WEEKS, CALL IF YOU  NEED ME SOONER  YOU  ARE  REFERRED FOR AN URGENT CHEST SCAN  YOU ARE REFERRED TO ENT URGENTLY RE DIZZINESS  HOPE THAT YOU WILL SOON FEEL BETTER

## 2021-05-27 NOTE — Telephone Encounter (Signed)
[facility] Dr Ezzard Standing could not see patient till September.  I called Dr Suszanne Conners and they have made her appt 08/18/21 1pm.  I have called patient and gave her this information.  Dr Suszanne Conners name address and phone.

## 2021-05-27 NOTE — Telephone Encounter (Signed)
Pt had in office appt with simpson 05-27-21

## 2021-05-27 NOTE — Progress Notes (Signed)
   Kelly [name]     MRN: [id number]      DOB: May 23, 1991   [name] is here with daily acute SOB x 5 mins, between 5:30 to 6 pm x 2 weeks 2 week h/o vertigo when she turns her head  Has had 2 ED visit, 1 UC , 1 OV so this is the 5th  Visit Initially had more frequent SOB as much as 5 / day  Initially, down to 2   Per day since Sat  ROS Denies recent fever or chills. Denies sinus pressure, nasal congestion, ear pain or sore throat. Denies chest congestion, productive cough or wheezing.  Denies abdominal pain, vomiting,diarrhea or constipation.   Denies dysuria, frequency, hesitancy or incontinence. Denies joint pain, swelling and limitation in mobility. Denies headaches, seizures, numbness, or tingling. Denies depression, anxiety or insomnia. Denies skin break down or rash.   PE  BP 121/86 (BP Location: Right Arm, Patient Position: Sitting, Cuff Size: Normal)   Pulse 75   Temp 98.5 F (36.9 C) (Oral)   Resp 18   Ht 5\' 4"  (1.626 m)   Wt 119 lb 12.8 oz (54.3 kg)   LMP 04/27/2021   SpO2 98%   BMI 20.56 kg/m   Patient alert and oriented and in no cardiopulmonary distress.  HEENT: No facial asymmetry, EOMI,     Neck supple .  Chest: Clear to auscultation bilaterally.  CVS: S1, S2 no murmurs, no S3.Regular rate.  ABD: Soft non tender.   Ext: No edema  MS: Adequate ROM spine, shoulders, hips and knees.  Skin: Intact, no ulcerations or rash noted.  Psych: Good eye contact, normal affect. Memory intact not anxious or depressed appearing.  CNS: CN 2-12 intact, power,  normal throughout.no focal deficits noted.   Assessment & Plan  BPV (benign positional vertigo), bilateral 2 week history, persistent, needs ENT asap  Shortness of breath Persistent recurrent episodes of shortness of breath, has had superficial thrombophlebitis, will get chest angio to r/o pE , low clinical suspicion but persistent symptoms warranty  Vertigo BPV x 2 weeks, needs ENT for   Management, I advised her to d/c meclizine as of no benefit. Will send to PT if ENT not available  Situational anxiety resolved and anxiety screen negative

## 2021-05-27 NOTE — Telephone Encounter (Signed)
thanks

## 2021-05-27 NOTE — Assessment & Plan Note (Signed)
2 week history, persistent, needs ENT asap

## 2021-05-28 ENCOUNTER — Other Ambulatory Visit: Payer: Self-pay

## 2021-05-28 ENCOUNTER — Other Ambulatory Visit: Payer: Self-pay | Admitting: Internal Medicine

## 2021-05-28 DIAGNOSIS — H8113 Benign paroxysmal vertigo, bilateral: Secondary | ICD-10-CM

## 2021-05-28 DIAGNOSIS — F418 Other specified anxiety disorders: Secondary | ICD-10-CM

## 2021-05-28 DIAGNOSIS — R42 Dizziness and giddiness: Secondary | ICD-10-CM

## 2021-05-28 NOTE — Telephone Encounter (Signed)
Noted, good 

## 2021-05-28 NOTE — Telephone Encounter (Signed)
Pt wanted to let you know she called around and she got a sooner appt for 7/6 with [facility] in [place]. She does need a new referral for them, I will send now.

## 2021-05-29 ENCOUNTER — Ambulatory Visit: Payer: BC Managed Care – PPO | Admitting: Family Medicine

## 2021-06-03 ENCOUNTER — Encounter: Payer: Self-pay | Admitting: Family Medicine

## 2021-06-03 NOTE — Assessment & Plan Note (Signed)
Persistent recurrent episodes of shortness of breath, has had superficial thrombophlebitis, will get chest angio to r/o pE , low clinical suspicion but persistent symptoms warranty

## 2021-06-03 NOTE — Assessment & Plan Note (Signed)
resolved and anxiety screen negative

## 2021-06-03 NOTE — Assessment & Plan Note (Signed)
BPV x 2 weeks, needs ENT for  Management, I advised her to d/c meclizine as of no benefit. Will send to PT if ENT not available

## 2021-06-05 ENCOUNTER — Other Ambulatory Visit: Payer: Self-pay

## 2021-06-09 ENCOUNTER — Ambulatory Visit: Payer: BC Managed Care – PPO | Admitting: Family Medicine

## 2021-06-16 ENCOUNTER — Ambulatory Visit: Payer: BC Managed Care – PPO | Admitting: Family Medicine

## 2021-06-25 ENCOUNTER — Ambulatory Visit: Payer: BC Managed Care – PPO | Admitting: Family Medicine

## 2021-06-25 ENCOUNTER — Encounter: Payer: Self-pay | Admitting: Family Medicine

## 2021-06-25 ENCOUNTER — Other Ambulatory Visit: Payer: Self-pay

## 2021-06-25 VITALS — BP 117/72 | HR 74 | Temp 98.6°F | Resp 20 | Ht 64.0 in | Wt 125.0 lb

## 2021-06-25 DIAGNOSIS — R42 Dizziness and giddiness: Secondary | ICD-10-CM

## 2021-06-25 DIAGNOSIS — E559 Vitamin D deficiency, unspecified: Secondary | ICD-10-CM

## 2021-06-25 DIAGNOSIS — R002 Palpitations: Secondary | ICD-10-CM

## 2021-06-25 DIAGNOSIS — E785 Hyperlipidemia, unspecified: Secondary | ICD-10-CM

## 2021-06-25 DIAGNOSIS — H8113 Benign paroxysmal vertigo, bilateral: Secondary | ICD-10-CM

## 2021-06-25 DIAGNOSIS — L658 Other specified nonscarring hair loss: Secondary | ICD-10-CM

## 2021-06-25 DIAGNOSIS — R7989 Other specified abnormal findings of blood chemistry: Secondary | ICD-10-CM | POA: Diagnosis not present

## 2021-06-25 DIAGNOSIS — L700 Acne vulgaris: Secondary | ICD-10-CM

## 2021-06-25 DIAGNOSIS — F418 Other specified anxiety disorders: Secondary | ICD-10-CM

## 2021-06-25 DIAGNOSIS — R0602 Shortness of breath: Secondary | ICD-10-CM

## 2021-06-25 MED ORDER — ERGOCALCIFEROL 1.25 MG (50000 UT) PO CAPS: 50000.0000 [IU] | ORAL_CAPSULE | ORAL | 2 refills | Status: DC

## 2021-06-25 NOTE — Patient Instructions (Addendum)
Reschedule July  appointment  to November , please call if you need me before  Fasting lipid and Vit D for November visit   Thankful you are much better  I DO recommend birth control, discuss with Gyne  It is important that you exercise regularly at least 30 minutes 5 times a week. If you develop chest pain, have severe difficulty breathing, or feel very tired, stop exercising immediately and seek medical attention   Think about what you will eat, plan ahead. Choose " clean, green, fresh or frozen" over canned, processed or packaged foods which are more sugary, salty and fatty. 70 to 75% of food eaten should be vegetables and fruit. Three meals at set times with snacks allowed between meals, but they must be fruit or vegetables. Aim to eat over a 12 hour period , example 7 am to 7 pm, and STOP after  your last meal of the day. Drink water,generally about 64 ounces per day, no other drink is as healthy. Fruit juice is best enjoyed in a healthy way, by EATING the fruit.   Thanks for choosing [facility], we consider it a privelige to serve you.

## 2021-06-26 ENCOUNTER — Encounter: Payer: Self-pay | Admitting: Family Medicine

## 2021-06-26 DIAGNOSIS — R7989 Other specified abnormal findings of blood chemistry: Secondary | ICD-10-CM | POA: Insufficient documentation

## 2021-06-26 DIAGNOSIS — E785 Hyperlipidemia, unspecified: Secondary | ICD-10-CM | POA: Insufficient documentation

## 2021-06-26 NOTE — Assessment & Plan Note (Signed)
Continue weekly supplement, check in 6 months

## 2021-06-26 NOTE — Assessment & Plan Note (Signed)
resolved 

## 2021-06-26 NOTE — Assessment & Plan Note (Signed)
Hyperlipidemia:Low fat diet discussed and encouraged.   Lipid Panel  Lab Results  Component Value Date   CHOL 215 (H) 03/11/2021   HDL 84 03/11/2021   LDLCALC 119 (H) 03/11/2021   TRIG 66 03/11/2021   CHOLHDL 2.6 03/11/2021     Updated lab needed at/ before next visit.

## 2021-06-26 NOTE — Assessment & Plan Note (Signed)
Currently decreased but reports flares approx 2 weeks out of 4, duration no more than 2 days / episode, she is to fucus on her mental health and  recommit to regular exercise

## 2021-06-26 NOTE — Progress Notes (Signed)
   Kelly [name]     MRN: [id number]      DOB: [birth year]   HPI Ms. Kelly [name] is here for follow up and re-evaluation  She reports resolution of vertigo, shortness of breath and palpitations. Has upcoming appointments with Cardiology, ENT and dermatology. Questions re contraception which she has not resumed. I advise her risk for clot is average , she had no pE, no longer requires compression hose, and needs some contraception, she will discuss with Gyne Also reports increased acne off of OCP,  Negative GAD screen and depression screen once more, however , I make clear the reality of mental health and the connection between physical symptoms   ROS Denies recent fever or chills. Denies sinus pressure, nasal congestion, ear pain or sore throat. Denies chest congestion, productive cough or wheezing. Denies chest pains, palpitations and leg swelling Denies abdominal pain, nausea, vomiting,diarrhea or constipation.   Denies dysuria, frequency, hesitancy or incontinence. Denies joint pain, swelling and limitation in mobility. Denies headaches, seizures, numbness, or tingling.   PE  BP 117/72 (BP Location: Right Arm, Patient Position: Sitting, Cuff Size: Large)   Pulse 74   Temp 98.6 F (37 C)   Resp 20   Ht 5\' 4"  (1.626 m)   Wt 125 lb (56.7 kg)   SpO2 98%   BMI 21.46 kg/m   Patient alert and oriented and in no cardiopulmonary distress.  HEENT: No facial asymmetry, EOMI,     Neck supple .  Chest: Clear to auscultation bilaterally.  CVS: S1, S2 no murmurs, no S3.Regular rate.  ABD: Soft non tender.   Ext: No edema  MS: Adequate ROM spine, shoulders, hips and knees.  Skin: Intact, no ulcerations or rash noted.  Psych: Good eye contact, normal affect. Memory intact not anxious or depressed appearing.  CNS: CN 2-12 intact, power,  normal throughout.no focal deficits noted.   Assessment & Plan  BPV (benign positional vertigo), bilateral resolved  Acne vulgaris Worsening  off of OCP  Light headedness resolved  Shortness of breath resolved  Palpitations resolved  Traction alopecia Dermatology treats  Situational anxiety Currently decreased but reports flares approx 2 weeks out of 4, duration no more than 2 days / episode, she is to fucus on her mental health and  recommit to regular exercise  Hyperlipidemia LDL goal <100 Hyperlipidemia:Low fat diet discussed and encouraged.   Lipid Panel  Lab Results  Component Value Date   CHOL 215 (H) 03/11/2021   HDL 84 03/11/2021   LDLCALC 119 (H) 03/11/2021   TRIG 66 03/11/2021   CHOLHDL 2.6 03/11/2021     Updated lab needed at/ before next visit.   Low vitamin D level Continue weekly supplement, check in 6 months

## 2021-06-26 NOTE — Assessment & Plan Note (Signed)
Worsening off of OCP

## 2021-06-26 NOTE — Assessment & Plan Note (Signed)
Dermatology treats

## 2021-07-02 DIAGNOSIS — J301 Allergic rhinitis due to pollen: Secondary | ICD-10-CM | POA: Diagnosis not present

## 2021-07-02 DIAGNOSIS — H8111 Benign paroxysmal vertigo, right ear: Secondary | ICD-10-CM | POA: Diagnosis not present

## 2021-07-03 ENCOUNTER — Ambulatory Visit: Payer: BC Managed Care – PPO | Admitting: Family Medicine

## 2021-07-03 DIAGNOSIS — L700 Acne vulgaris: Secondary | ICD-10-CM | POA: Diagnosis not present

## 2021-07-03 DIAGNOSIS — Z5181 Encounter for therapeutic drug level monitoring: Secondary | ICD-10-CM | POA: Diagnosis not present

## 2021-07-03 DIAGNOSIS — L658 Other specified nonscarring hair loss: Secondary | ICD-10-CM | POA: Diagnosis not present

## 2021-07-11 ENCOUNTER — Telehealth: Payer: BC Managed Care – PPO | Admitting: Adult Health

## 2021-07-19 NOTE — Progress Notes (Deleted)
Cardiology Office Note:    Date:  07/19/2021   ID:  Kelly [name], DOB 08/25/1991, MRN [id number]  PCP:  Kelly Perches, MD   [facility] Providers Cardiologist:  None { Click to update primary MD,subspecialty MD or APP then REFRESH:1}    Referring MD: Kelly Perches, MD    History of Present Illness:    Kelly [name] is a 30 y.o. female with no significant past medical history who was referred by Dr. Lodema [name] for further evaluation of palpitations and lightheadedness.  Past Medical History:  Diagnosis Date   Allergy    penicillin   Situational anxiety 05/21/2021   Vertigo     Past Surgical History:  Procedure Laterality Date   ADENOIDECTOMY     TONSILLECTOMY     under 12     Current Medications: No outpatient medications have been marked as taking for the 07/22/21 encounter (Appointment) with Meriam Sprague, MD.     Allergies:   Penicillins   Social History   Socioeconomic History   Marital status: Single    Spouse name: Not on file   Number of children: 0   Years of education: 16   Highest education level: Not on file  Occupational History   Occupation: HR director    Comment: [employer]  Tobacco Use   Smoking status: Never   Smokeless tobacco: Never  Vaping Use   Vaping Use: Never used  Substance and Sexual Activity   Alcohol use: No   Drug use: No   Sexual activity: Yes    Birth control/protection: Condom  Other Topics Concern   Not on file  Social History Narrative   Graduated   Occupational hygienist for [employer]   Lives in [place] - lives alone   Social Determinants of Health   Financial Resource Strain: Low Risk    Difficulty of Paying Living Expenses: Not hard at all  Food Insecurity: No Food Insecurity   Worried About Programme researcher, broadcasting/film/video in the Last Year: Never true   Ran Out of Food in the Last Year: Never true  Transportation Needs: No Transportation Needs   Lack of Transportation (Medical): No   Lack of  Transportation (Non-Medical): No  Physical Activity: Insufficiently Active   Days of Exercise per Week: 2 days   Minutes of Exercise per Session: 60 min  Stress: No Stress Concern Present   Feeling of Stress : Only a little  Social Connections: Moderately Isolated   Frequency of Communication with Friends and Family: More than three times a week   Frequency of Social Gatherings with Friends and Family: More than three times a week   Attends Religious Services: More than 4 times per year   Active Member of [employer] or Organizations: No   Attends Engineer, structural: Patient refused   Marital Status: Never married     Family History: The patient's ***family history includes Hypertension in her father, maternal grandfather, maternal grandmother, mother, paternal grandfather, paternal grandmother, and sister.  ROS:   Please see the history of present illness.    *** All other systems reviewed and are negative.  EKGs/Labs/Other Studies Reviewed:    The following studies were reviewed today: CTA Jun 26, 2021: FINDINGS: Cardiovascular: There is no demonstrable pulmonary embolus. There is no thoracic aortic aneurysm or dissection. Visualized great vessels appear unremarkable. No pericardial effusion or pericardial thickening evident.   Mediastinum/Nodes: Visualized thyroid appears unremarkable. No appreciable thoracic adenopathy. No esophageal lesions.  Lungs/Pleura: Lungs are clear. No pleural effusions. No pneumothorax. Trachea and major bronchial structures appear unremarkable.   Upper Abdomen: Visualized upper abdominal structures appear unremarkable.   Musculoskeletal: No blastic or lytic bone lesions. No evident chest wall lesions.   Review of the MIP images confirms the above findings.   IMPRESSION: 1. No evident pulmonary embolus. No thoracic aortic aneurysm or dissection.   2.  Lungs clear.   3.  No evident adenopathy.  EKG:  EKG is *** ordered today.  The  ekg ordered today demonstrates ***  Recent Labs: 05/22/2021: ALT 10 05/23/2021: BUN 11; Creatinine, Ser 0.76; Hemoglobin 13.6; Platelets 304; Potassium 4.1; Sodium 135; TSH 2.000  Recent Lipid Panel    Component Value Date/Time   CHOL 215 (H) 03/11/2021 1423   TRIG 66 03/11/2021 1423   HDL 84 03/11/2021 1423   CHOLHDL 2.6 03/11/2021 1423   CHOLHDL 3.1 01/29/2020 1212   LDLCALC 119 (H) 03/11/2021 1423   LDLCALC 103 (H) 01/29/2020 1212     Risk Assessment/Calculations:   {Does this patient have ATRIAL FIBRILLATION?:[id number]}       Physical Exam:    VS:  There were no vitals taken for this visit.    Wt Readings from Last 3 Encounters:  06/25/21 125 lb (56.7 kg)  05/27/21 119 lb 12.8 oz (54.3 kg)  05/23/21 119 lb (54 kg)     GEN: *** Well nourished, well developed in no acute distress HEENT: Normal NECK: No JVD; No carotid bruits LYMPHATICS: No lymphadenopathy CARDIAC: ***RRR, no murmurs, rubs, gallops RESPIRATORY:  Clear to auscultation without rales, wheezing or rhonchi  ABDOMEN: Soft, non-tender, non-distended MUSCULOSKELETAL:  No edema; No deformity  SKIN: Warm and dry NEUROLOGIC:  Alert and oriented x 3 PSYCHIATRIC:  Normal affect   ASSESSMENT:    No diagnosis found. PLAN:    In order of problems listed above:  #Palpitations: -?Monitor -?TTE   {Are you ordering a CV Procedure (e.g. stress test, cath, DCCV, TEE, etc)?   Press F2        :[id number]}    Medication Adjustments/Labs and Tests Ordered: Current medicines are reviewed at length with the patient today.  Concerns regarding medicines are outlined above.  No orders of the defined types were placed in this encounter.  No orders of the defined types were placed in this encounter.   There are no Patient Instructions on file for this visit.   Signed, Meriam Sprague, MD  07/19/2021 4:01 PM    [facility]

## 2021-07-22 ENCOUNTER — Ambulatory Visit: Payer: BC Managed Care – PPO | Admitting: Cardiology

## 2021-08-04 ENCOUNTER — Encounter: Payer: Self-pay | Admitting: Family Medicine

## 2021-08-05 ENCOUNTER — Encounter: Payer: Self-pay | Admitting: Nurse Practitioner

## 2021-08-05 ENCOUNTER — Other Ambulatory Visit: Payer: Self-pay

## 2021-08-05 ENCOUNTER — Ambulatory Visit: Payer: BC Managed Care – PPO | Admitting: Nurse Practitioner

## 2021-08-05 VITALS — BP 121/81 | HR 78 | Temp 97.5°F | Ht 64.0 in | Wt 122.0 lb

## 2021-08-05 DIAGNOSIS — H9192 Unspecified hearing loss, left ear: Secondary | ICD-10-CM | POA: Insufficient documentation

## 2021-08-05 MED ORDER — FLUTICASONE PROPIONATE 50 MCG/ACT NA SUSP: NASAL | 6 refills | Status: DC

## 2021-08-05 NOTE — Progress Notes (Signed)
Acute Office Visit  Subjective:    Patient ID: Kelly [name], female    DOB: [birth year], 29 y.o.   MRN: [id number]  Chief Complaint  Patient presents with   Otalgia    L ear pain, ongoing x2 days.     HPI Patient is in today for right ear pain. Started 2 days ago. Denies fever or drainage from ear. Has decreased hearing.    Past Medical History:  Diagnosis Date   Allergy    penicillin   Situational anxiety 05/21/2021   Vertigo     Past Surgical History:  Procedure Laterality Date   ADENOIDECTOMY     TONSILLECTOMY     under 27     Family History  Problem Relation Age of Onset   Hypertension Mother    Hypertension Father    Hypertension Sister    Hypertension Maternal Grandmother    Hypertension Maternal Grandfather    Hypertension Paternal Grandmother    Hypertension Paternal Grandfather     Social History   Socioeconomic History   Marital status: Single    Spouse name: Not on file   Number of children: 0   Years of education: 16   Highest education level: Not on file  Occupational History   Occupation: HR Mudlogger    Comment: [employer]  Tobacco Use   Smoking status: Never   Smokeless tobacco: Never  Vaping Use   Vaping Use: Never used  Substance and Sexual Activity   Alcohol use: No   Drug use: No   Sexual activity: Yes    Birth control/protection: Condom  Other Topics Concern   Not on file  Social History Narrative   Graduated   Scientist, 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   Social Determinants of Health   Financial Resource Strain: Low Risk    Difficulty of Paying Living Expenses: Not hard at all  Food Insecurity: No Food Insecurity   Worried About Charity fundraiser in the Last Year: Never true   [place] in the Last Year: Never true  Transportation Needs: No Transportation Needs   Lack of Transportation (Medical): No   Lack of Transportation (Non-Medical): No  Physical Activity: Insufficiently  Active   Days of Exercise per Week: 2 days   Minutes of Exercise per Session: 60 min  Stress: No Stress Concern Present   Feeling of Stress : Only a little  Social Connections: Moderately Isolated   Frequency of Communication with Friends and Family: More than three times a week   Frequency of Social Gatherings with Friends and Family: More than three times a week   Attends Religious Services: More than 4 times per year   Active Member of [employer] or Organizations: No   Attends Music therapist: Patient refused   Marital Status: Never married  Human resources officer Violence: Not At Risk   Fear of Current or Ex-Partner: No   Emotionally Abused: No   Physically Abused: No   Sexually Abused: No    Outpatient Medications Prior to Visit  Medication Sig Dispense Refill   clobetasol (TEMOVATE) 0.05 % external solution Apply topically.     doxycycline (MONODOX) 100 MG capsule Take 100 mg by mouth 2 (two) times daily.     ergocalciferol (VITAMIN D2) 1.25 MG (50000 UT) capsule Take 1 capsule (50,000 Units total) by mouth once a week. One capsule once weekly 12 capsule 2   montelukast (SINGULAIR) 10  MG tablet Take 1 tablet (10 mg total) by mouth at bedtime. 30 tablet 5   spironolactone (ALDACTONE) 50 MG tablet SMARTSIG:1 Pill By Mouth Every Morning     Norethindrone-Ethinyl Estradiol-Fe Biphas (LO LOESTRIN FE) 1 MG-10 MCG / 10 MCG tablet Take 1 tablet by mouth daily. Take 1 daily by mouth (Patient not taking: Reported on 06/25/2021) 84 tablet 4   No facility-administered medications prior to visit.    Allergies  Allergen Reactions   Penicillins Rash    Review of Systems  Constitutional: Negative.   HENT:  Positive for hearing loss. Negative for ear discharge and ear pain.        Left ear fullness  Respiratory: Negative.    Cardiovascular: Negative.       Objective:    Physical Exam Constitutional:      Appearance: Normal appearance.  HENT:     Right Ear: Tympanic membrane,  ear canal and external ear normal.     Left Ear: Tympanic membrane, ear canal and external ear normal.  Musculoskeletal:     Cervical back: Normal range of motion and neck supple.  Neurological:     Mental Status: She is alert.    BP 121/81 (BP Location: Left Arm, Patient Position: Sitting, Cuff Size: Large)   Pulse 78   Temp (!) 97.5 F (36.4 C) (Temporal)   Ht $R'5\' 4"'dM$  (1.626 m)   Wt 122 lb (55.3 kg)   LMP 08/02/2021 (Exact Date)   SpO2 100%   BMI 20.94 kg/m  Wt Readings from Last 3 Encounters:  08/05/21 122 lb (55.3 kg)  06/25/21 125 lb (56.7 kg)  05/27/21 119 lb 12.8 oz (54.3 kg)    Health Maintenance Due  Topic Date Due   INFLUENZA VACCINE  07/28/2021    There are no preventive care reminders to display for this patient.   Lab Results  Component Value Date   TSH 2.000 05/23/2021   Lab Results  Component Value Date   WBC 4.6 05/23/2021   HGB 13.6 05/23/2021   HCT 40.9 05/23/2021   MCV 96.2 05/23/2021   PLT 304 05/23/2021   Lab Results  Component Value Date   NA 135 05/23/2021   K 4.1 05/23/2021   CO2 27 05/23/2021   GLUCOSE 92 05/23/2021   BUN 11 05/23/2021   CREATININE 0.76 05/23/2021   BILITOT 0.5 05/22/2021   ALKPHOS 31 (L) 05/22/2021   AST 17 05/22/2021   ALT 10 05/22/2021   PROT 8.3 (H) 05/22/2021   ALBUMIN 4.1 05/22/2021   CALCIUM 9.1 05/23/2021   ANIONGAP 6 05/23/2021   EGFR 119 03/11/2021   Lab Results  Component Value Date   CHOL 215 (H) 03/11/2021   Lab Results  Component Value Date   HDL 84 03/11/2021   Lab Results  Component Value Date   LDLCALC 119 (H) 03/11/2021   Lab Results  Component Value Date   TRIG 66 03/11/2021   Lab Results  Component Value Date   CHOLHDL 2.6 03/11/2021   No results found for: HGBA1C     Assessment & Plan:   Problem List Items Addressed This Visit       Nervous and Auditory   Left ear hearing loss - Primary    -fullness started 2 days ago -no sign of middle or outer ear  infection -will treat for eustachian tube disorder -Rx. flonase       Relevant Medications   fluticasone (FLONASE) 50 MCG/ACT nasal spray  Meds ordered this encounter  Medications   fluticasone (FLONASE) 50 MCG/ACT nasal spray    Sig: Use 2 sprays in each nostril BID for a week. After 1 week, decrease to 1 spray in each nostril BID as needed for congestion/allergies.    Dispense:  16 g    Refill:  Dixon, NP

## 2021-08-05 NOTE — Assessment & Plan Note (Signed)
-  fullness started 2 days ago -no sign of middle or outer ear infection -will treat for eustachian tube disorder -Rx. flonase

## 2021-08-07 ENCOUNTER — Encounter: Payer: Self-pay | Admitting: Allergy

## 2021-08-07 ENCOUNTER — Other Ambulatory Visit: Payer: Self-pay

## 2021-08-07 ENCOUNTER — Ambulatory Visit: Payer: BC Managed Care – PPO | Admitting: Allergy

## 2021-08-07 VITALS — BP 116/74 | HR 80 | Temp 98.7°F | Resp 17

## 2021-08-07 DIAGNOSIS — H1010 Acute atopic conjunctivitis, unspecified eye: Secondary | ICD-10-CM | POA: Insufficient documentation

## 2021-08-07 DIAGNOSIS — J3089 Other allergic rhinitis: Secondary | ICD-10-CM | POA: Diagnosis not present

## 2021-08-07 DIAGNOSIS — H6982 Other specified disorders of Eustachian tube, left ear: Secondary | ICD-10-CM | POA: Diagnosis not present

## 2021-08-07 DIAGNOSIS — H6992 Unspecified Eustachian tube disorder, left ear: Secondary | ICD-10-CM | POA: Insufficient documentation

## 2021-08-07 DIAGNOSIS — H1013 Acute atopic conjunctivitis, bilateral: Secondary | ICD-10-CM | POA: Diagnosis not present

## 2021-08-07 DIAGNOSIS — J302 Other seasonal allergic rhinitis: Secondary | ICD-10-CM

## 2021-08-07 MED ORDER — EPINEPHRINE 0.3 MG/0.3ML IJ SOAJ: 0.3000 mg | INTRAMUSCULAR | 2 refills | Status: DC | PRN

## 2021-08-07 NOTE — Patient Instructions (Addendum)
Environmental allergies Take over the counter decongestant such as sudafed for a few days to help with the ear fullness.  2022 skin testing showed: Positive to grass pollen, weed, ragweed, trees, mold. Continue environmental control measures as below. Use over the counter antihistamines such as Claritin (loratadine), Allegra (fexofenadine), or Xyzal (levocetirizine) daily as needed. May take twice a day during allergy flares. May switch antihistamines every few months. Use Flonase (fluticasone) nasal spray 1 spray per nostril twice a day as needed for nasal congestion.  Continue Singulair (montelukast) 10mg  daily at night. May use olopatadine eye drops 0.2% once a day as needed for itchy/watery eyes.  Start allergy injections. Had a detailed discussion with patient/family that clinical history is suggestive of allergic rhinitis, and may benefit from allergy immunotherapy (AIT). Discussed in detail regarding the dosing, schedule, side effects (mild to moderate local allergic reaction and rarely systemic allergic reactions including anaphylaxis), and benefits (significant improvement in nasal symptoms, seasonal flares of asthma) of immunotherapy with the patient. There is significant time commitment involved with allergy shots, which includes weekly immunotherapy injections for first 9-12 months and then biweekly to monthly injections for 3-5 years. Consent was signed. I have prescribed epinephrine injectable and demonstrated proper use. For mild symptoms you can take over the counter antihistamines such as Benadryl and monitor symptoms closely. If symptoms worsen or if you have severe symptoms including breathing issues, throat closure, significant swelling, whole body hives, severe diarrhea and vomiting, lightheadedness then inject epinephrine and seek immediate medical care afterwards.Emergency action plan given.  Follow up in 6 months or sooner if needed.  Follow up in 3 weeks for first injection.    Reducing Pollen Exposure Pollen seasons: trees (spring), grass (summer) and ragweed/weeds (fall). Keep windows closed in your home and car to lower pollen exposure.  Install air conditioning in the bedroom and throughout the house if possible.  Avoid going out in dry windy days - especially early morning. Pollen counts are highest between 5 - 10 AM and on dry, hot and windy days.  Save outside activities for late afternoon or after a heavy rain, when pollen levels are lower.  Avoid mowing of grass if you have grass pollen allergy. Be aware that pollen can also be transported indoors on people and pets.  Dry your clothes in an automatic dryer rather than hanging them outside where they might collect pollen.  Rinse hair and eyes before bedtime.  Mold Control Mold and fungi can grow on a variety of surfaces provided certain temperature and moisture conditions exist.  Outdoor molds grow on plants, decaying vegetation and soil. The major outdoor mold, Alternaria and Cladosporium, are found in very high numbers during hot and dry conditions. Generally, a late summer - fall peak is seen for common outdoor fungal spores. Rain will temporarily lower outdoor mold spore count, but counts rise rapidly when the rainy period ends. The most important indoor molds are Aspergillus and Penicillium. Dark, humid and poorly ventilated basements are ideal sites for mold growth. The next most common sites of mold growth are the bathroom and the kitchen. Outdoor (Seasonal) Mold Control Use air conditioning and keep windows closed. Avoid exposure to decaying vegetation. Avoid leaf raking. Avoid grain handling. Consider wearing a face mask if working in moldy areas.  Indoor (Perennial) Mold Control  Maintain humidity below 50%. Get rid of mold growth on hard surfaces with water, detergent and, if necessary, 5% bleach (do not mix with other cleaners). Then dry the area completely.  If mold covers an area more than  10 square feet, consider hiring an indoor environmental professional. For clothing, washing with soap and water is best. If moldy items cannot be cleaned and dried, throw them away. Remove sources e.g. contaminated carpets. Repair and seal leaking roofs or pipes. Using dehumidifiers in damp basements may be helpful, but empty the water and clean units regularly to prevent mildew from forming. All rooms, especially basements, bathrooms and kitchens, require ventilation and cleaning to deter mold and mildew growth. Avoid carpeting on concrete or damp floors, and storing items in damp areas.

## 2021-08-07 NOTE — Assessment & Plan Note (Addendum)
Past history - Rhinoconjunctivitis symptoms for 20+ years mainly in the spring and fall.  Noticed symptoms this winter as well.  Zyrtec caused nosebleeds. 2022 skin testing showed: Positive to grass pollen, weed, ragweed, trees, mold. Interim history - still symptomatic especially if going outdoors. Interested in starting AIT.  Continue environmental control measures as below.  Use over the counter antihistamines such as Claritin (loratadine), Allegra (fexofenadine), or Xyzal (levocetirizine) daily as needed. May take twice a day during allergy flares. May switch antihistamines every few months.  Use Flonase (fluticasone) nasal spray 1 spray per nostril twice a day as needed for nasal congestion.  . Continue Singulair (montelukast) 10mg  daily at night.  May use olopatadine eye drops 0.2% once a day as needed for itchy/watery eyes.  Start allergy injections.  Had a detailed discussion with patient/family that clinical history is suggestive of allergic rhinitis, and may benefit from allergy immunotherapy (AIT). Discussed in detail regarding the dosing, schedule, side effects (mild to moderate local allergic reaction and rarely systemic allergic reactions including anaphylaxis), and benefits (significant improvement in nasal symptoms, seasonal flares of asthma) of immunotherapy with the patient. There is significant time commitment involved with allergy shots, which includes weekly immunotherapy injections for first 9-12 months and then biweekly to monthly injections for 3-5 years. Consent was signed.  I have prescribed epinephrine injectable and demonstrated proper use. For mild symptoms you can take over the counter antihistamines such as Benadryl and monitor symptoms closely. If symptoms worsen or if you have severe symptoms including breathing issues, throat closure, significant swelling, whole body hives, severe diarrhea and vomiting, lightheadedness then inject epinephrine and seek immediate  medical care afterwards.Emergency action plan given.

## 2021-08-07 NOTE — Assessment & Plan Note (Signed)
Left sided ear fullness x few days. Started on Flonase.  Take over the counter decongestant such as sudafed for a few days to help with the ear fullness.  Use Flonase (fluticasone) nasal spray 1 spray per nostril twice a day as needed.

## 2021-08-07 NOTE — Progress Notes (Signed)
Follow Up Note  RE: Kelly [name] MRN: [id number] DOB: October 19, 1991 Date of Office [name]: 08/07/2021  Referring provider: Kerri Perches, MD Primary care provider: Kerri Perches, MD  Chief Complaint: Allergic Rhinitis   History of Present Illness: I had the pleasure of seeing Kelly [name] at the [facility] on 08/07/2021. She is a 30 y.o. female, who is being followed for allergic rhinoconjunctivitis. Her previous allergy office [name] was on 01/15/2021 with Dr. Selena [name]. Today is a regular follow up [name].  Allergic rhino conjunctivitis Symptoms flare when outdoors - rhinorrhea, watery eyes and itchy/scratchy throat.  Sunday noticed some clogged ear on her left side and started Flonase 1 spray per nostril once a day with no benefit.  Currently taking Singulair daily with some benefit, Claritin prn and olopatadine prn.  Interested in starting allergy shots.  Assessment and Plan: Kelly [name] is a 30 y.o. female with: Seasonal and perennial allergic rhinoconjunctivitis Past history - Rhinoconjunctivitis symptoms for 20+ years mainly in the spring and fall.  Noticed symptoms this winter as well.  Zyrtec caused nosebleeds. 2022 skin testing showed: Positive to grass pollen, weed, ragweed, trees, mold. Interim history - still symptomatic especially if going outdoors. Interested in starting AIT. Continue environmental control measures as below. Use over the counter antihistamines such as Claritin (loratadine), Allegra (fexofenadine), or Xyzal (levocetirizine) daily as needed. May take twice a day during allergy flares. May switch antihistamines every few months. Use Flonase (fluticasone) nasal spray 1 spray per nostril twice a day as needed for nasal congestion.  Continue Singulair (montelukast) 10mg  daily at night. May use olopatadine eye drops 0.2% once a day as needed for itchy/watery eyes. Start allergy injections. Had a detailed  discussion with patient/family that clinical history is suggestive of allergic rhinitis, and may benefit from allergy immunotherapy (AIT). Discussed in detail regarding the dosing, schedule, side effects (mild to moderate local allergic reaction and rarely systemic allergic reactions including anaphylaxis), and benefits (significant improvement in nasal symptoms, seasonal flares of asthma) of immunotherapy with the patient. There is significant time commitment involved with allergy shots, which includes weekly immunotherapy injections for first 9-12 months and then biweekly to monthly injections for 3-5 years. Consent was signed. I have prescribed epinephrine injectable and demonstrated proper use. For mild symptoms you can take over the counter antihistamines such as Benadryl and monitor symptoms closely. If symptoms worsen or if you have severe symptoms including breathing issues, throat closure, significant swelling, whole body hives, severe diarrhea and vomiting, lightheadedness then inject epinephrine and seek immediate medical care afterwards.Emergency action plan given.  Eustachian tube dysfunction, left Left sided ear fullness x few days. Started on Flonase. Take over the counter decongestant such as sudafed for a few days to help with the ear fullness. Use Flonase (fluticasone) nasal spray 1 spray per nostril twice a day as needed.  Return in about 6 months (around 02/07/2022).  Meds ordered this encounter  Medications   EPINEPHrine 0.3 mg/0.3 mL IJ SOAJ injection    Sig: Inject 0.3 mg into the muscle as needed for anaphylaxis.    Dispense:  1 each    Refill:  2    May dispense generic/Mylan/Teva brand.    Lab Orders  No laboratory test(s) ordered today    Diagnostics: None.  Medication List:  Current Outpatient Medications  Medication Sig Dispense Refill   clobetasol (TEMOVATE) 0.05 % external solution Apply topically.  doxycycline (MONODOX) 100 MG capsule Take 100 mg by  mouth 2 (two) times daily.     EPINEPHrine 0.3 mg/0.3 mL IJ SOAJ injection Inject 0.3 mg into the muscle as needed for anaphylaxis. 1 each 2   ergocalciferol (VITAMIN D2) 1.25 MG (50000 UT) capsule Take 1 capsule (50,000 Units total) by mouth once a week. One capsule once weekly 12 capsule 2   fluticasone (FLONASE) 50 MCG/ACT nasal spray Use 2 sprays in each nostril BID for a week. After 1 week, decrease to 1 spray in each nostril BID as needed for congestion/allergies. 16 g 6   montelukast (SINGULAIR) 10 MG tablet Take 1 tablet (10 mg total) by mouth at bedtime. 30 tablet 5   spironolactone (ALDACTONE) 50 MG tablet SMARTSIG:1 Pill By Mouth Every Morning     tretinoin (RETIN-A) 0.025 % cream SMARTSIG:Topical Every Evening     No current facility-administered medications for this [name].   Allergies: Allergies  Allergen Reactions   Penicillins Rash   I reviewed her past medical history, social history, family history, and environmental history and no significant changes have been reported from her previous [name].  Review of Systems  Constitutional:  Negative for appetite change, chills, fever and unexpected weight change.  HENT:  Positive for congestion, rhinorrhea and sneezing.   Eyes:  Positive for itching.  Respiratory:  Negative for cough, chest tightness, shortness of breath and wheezing.   Cardiovascular:  Negative for chest pain.  Gastrointestinal:  Negative for abdominal pain.  Genitourinary:  Negative for difficulty urinating.  Skin:  Negative for rash.  Allergic/Immunologic: Positive for environmental allergies.  Neurological:  Negative for headaches.   Objective: BP 116/74   Pulse 80   Temp 98.7 F (37.1 C) (Temporal)   Resp 17   LMP 08/02/2021 (Exact Date)   SpO2 99%  There is no height or weight on file to calculate BMI. Physical Exam Vitals and nursing note reviewed.  Constitutional:      Appearance: Normal appearance. She is well-developed.  HENT:     Head:  Normocephalic and atraumatic.     Right Ear: Tympanic membrane and external ear normal.     Left Ear: Tympanic membrane and external ear normal.     Nose: Nose normal.     Mouth/Throat:     Mouth: Mucous membranes are moist.     Pharynx: Oropharynx is clear.  Eyes:     Conjunctiva/sclera: Conjunctivae normal.  Cardiovascular:     Rate and Rhythm: Normal rate and regular rhythm.     Heart sounds: Normal heart sounds. No murmur heard.   No friction rub. No gallop.  Pulmonary:     Effort: Pulmonary effort is normal.     Breath sounds: Normal breath sounds. No wheezing, rhonchi or rales.  Musculoskeletal:     Cervical back: Neck supple.  Skin:    General: Skin is warm.     Findings: No rash.  Neurological:     Mental Status: She is alert and oriented to person, place, and time.  Psychiatric:        Behavior: Behavior normal.  Previous notes and tests were reviewed. The plan was reviewed with the patient/family, and all questions/concerned were addressed.  It was my pleasure to see Kelly [name] today and participate in her care. Please feel free to contact me with any questions or concerns.  Sincerely,  Wyline Mood, DO Allergy & Immunology  [facility] office: [phone] [facility] office:  [phone]  

## 2021-08-12 DIAGNOSIS — J302 Other seasonal allergic rhinitis: Secondary | ICD-10-CM | POA: Diagnosis not present

## 2021-08-12 NOTE — Progress Notes (Signed)
Aeroallergen Immunotherapy   Ordering Provider: Dr. Wyline Mood   Patient Details  Name: Kelly [name]  MRN: [id number]  Date of Birth: 02-15-1991   Order 1 of 2   Vial Label: G-[id number]-[id number]-T   0.3 ml (Volume)  BAU Concentration -- 7 Grass Mix* 100,000 ([address] [place], [place], [place], [place], [place], Sweet [place], [place])  0.2 ml (Volume)  1:20 Concentration -- [place]  0.3 ml (Volume)  BAU Concentration -- [place] 10,000  0.2 ml (Volume)  1:20 Concentration -- [place]  0.3 ml (Volume)  1:20 Concentration -- Ragweed Mix  0.5 ml (Volume)  1:20 Concentration -- Weed Mix*  0.5 ml (Volume)  1:20 Concentration -- Eastern 10 Tree Mix (also Sweet Gum)    2.3  ml Extract Subtotal  2.7  ml Diluent  5.0  ml Maintenance Total   Schedule:  B  Blue Vial (1:100,000): Schedule B (6 doses)  Yellow Vial (1:10,000): Schedule B (6 doses)  Green Vial (1:1,000): Schedule B (6 doses)  Red Vial (1:100): Schedule A (10 doses)   Special Instructions: may build up 1-2 times per week.

## 2021-08-12 NOTE — Progress Notes (Signed)
Aeroallergen Immunotherapy   Ordering Provider: Dr. Wyline Mood   Patient Details  Name: Kelly [name]  MRN: [id number]  Date of Birth: 10-15-1991   Order 2 of 2   Vial Label: M   0.2 ml (Volume)  1:20 Concentration -- Alternaria alternata  0.2 ml (Volume)  1:20 Concentration -- Cladosporium herbarum  0.2 ml (Volume)  1:10 Concentration -- Aspergillus mix  0.2 ml (Volume)  1:10 Concentration -- Penicillium mix    0.8  ml Extract Subtotal  4.2  ml Diluent  5.0  ml Maintenance Total   Schedule:  B  Blue Vial (1:100,000): Schedule B (6 doses)  Yellow Vial (1:10,000): Schedule B (6 doses)  Green Vial (1:1,000): Schedule B (6 doses)  Red Vial (1:100): Schedule A (10 doses)   Special Instructions: may build up 1-2 times per week.

## 2021-08-12 NOTE — Progress Notes (Signed)
VIALS MADE. EXP 08-12-22 

## 2021-08-13 DIAGNOSIS — J301 Allergic rhinitis due to pollen: Secondary | ICD-10-CM | POA: Diagnosis not present

## 2021-08-28 ENCOUNTER — Other Ambulatory Visit: Payer: Self-pay

## 2021-08-28 ENCOUNTER — Ambulatory Visit: Payer: BC Managed Care – PPO

## 2021-08-28 DIAGNOSIS — J309 Allergic rhinitis, unspecified: Secondary | ICD-10-CM | POA: Diagnosis not present

## 2021-08-28 NOTE — Progress Notes (Signed)
Immunotherapy   Patient Details  Name: Kelly [name] MRN: [id number] Date of Birth: [birth year]  08/28/2021  [name] started injections for  allergies, Blue vial. Patient waited 30 minutes in the office without any reactions Following schedule: B  Frequency:1-2 times per week Epi-Pen:Epi-Pen Available   Consent signed and patient instructions given.   Florence Canner 08/28/2021, 8:50 AM

## 2021-09-04 ENCOUNTER — Other Ambulatory Visit: Payer: Self-pay

## 2021-09-04 ENCOUNTER — Ambulatory Visit (INDEPENDENT_AMBULATORY_CARE_PROVIDER_SITE_OTHER): Payer: BC Managed Care – PPO

## 2021-09-04 DIAGNOSIS — J309 Allergic rhinitis, unspecified: Secondary | ICD-10-CM | POA: Diagnosis not present

## 2021-09-08 ENCOUNTER — Ambulatory Visit (INDEPENDENT_AMBULATORY_CARE_PROVIDER_SITE_OTHER): Payer: BC Managed Care – PPO | Admitting: Otolaryngology

## 2021-09-11 ENCOUNTER — Other Ambulatory Visit: Payer: Self-pay

## 2021-09-11 ENCOUNTER — Ambulatory Visit (INDEPENDENT_AMBULATORY_CARE_PROVIDER_SITE_OTHER): Payer: BC Managed Care – PPO

## 2021-09-11 DIAGNOSIS — J309 Allergic rhinitis, unspecified: Secondary | ICD-10-CM | POA: Diagnosis not present

## 2021-09-18 ENCOUNTER — Ambulatory Visit (INDEPENDENT_AMBULATORY_CARE_PROVIDER_SITE_OTHER): Payer: BC Managed Care – PPO

## 2021-09-18 ENCOUNTER — Other Ambulatory Visit: Payer: Self-pay

## 2021-09-18 DIAGNOSIS — J309 Allergic rhinitis, unspecified: Secondary | ICD-10-CM

## 2021-09-25 ENCOUNTER — Ambulatory Visit (INDEPENDENT_AMBULATORY_CARE_PROVIDER_SITE_OTHER): Payer: BC Managed Care – PPO

## 2021-09-25 ENCOUNTER — Other Ambulatory Visit: Payer: Self-pay

## 2021-09-25 DIAGNOSIS — J309 Allergic rhinitis, unspecified: Secondary | ICD-10-CM

## 2021-09-30 ENCOUNTER — Ambulatory Visit (INDEPENDENT_AMBULATORY_CARE_PROVIDER_SITE_OTHER): Payer: BC Managed Care – PPO

## 2021-09-30 ENCOUNTER — Other Ambulatory Visit: Payer: Self-pay

## 2021-09-30 DIAGNOSIS — J309 Allergic rhinitis, unspecified: Secondary | ICD-10-CM | POA: Diagnosis not present

## 2021-10-09 ENCOUNTER — Other Ambulatory Visit: Payer: Self-pay

## 2021-10-09 ENCOUNTER — Ambulatory Visit (INDEPENDENT_AMBULATORY_CARE_PROVIDER_SITE_OTHER): Payer: BC Managed Care – PPO

## 2021-10-09 DIAGNOSIS — J309 Allergic rhinitis, unspecified: Secondary | ICD-10-CM

## 2021-10-16 ENCOUNTER — Ambulatory Visit (INDEPENDENT_AMBULATORY_CARE_PROVIDER_SITE_OTHER): Payer: BC Managed Care – PPO

## 2021-10-16 ENCOUNTER — Other Ambulatory Visit: Payer: Self-pay

## 2021-10-16 DIAGNOSIS — J309 Allergic rhinitis, unspecified: Secondary | ICD-10-CM

## 2021-10-18 ENCOUNTER — Other Ambulatory Visit: Payer: Self-pay | Admitting: Allergy

## 2021-10-23 ENCOUNTER — Ambulatory Visit (INDEPENDENT_AMBULATORY_CARE_PROVIDER_SITE_OTHER): Payer: BC Managed Care – PPO

## 2021-10-23 ENCOUNTER — Other Ambulatory Visit: Payer: Self-pay

## 2021-10-23 DIAGNOSIS — J309 Allergic rhinitis, unspecified: Secondary | ICD-10-CM

## 2021-10-30 ENCOUNTER — Ambulatory Visit (INDEPENDENT_AMBULATORY_CARE_PROVIDER_SITE_OTHER): Payer: BC Managed Care – PPO

## 2021-10-30 ENCOUNTER — Other Ambulatory Visit: Payer: Self-pay

## 2021-10-30 DIAGNOSIS — J309 Allergic rhinitis, unspecified: Secondary | ICD-10-CM

## 2021-11-03 ENCOUNTER — Ambulatory Visit: Payer: BC Managed Care – PPO | Admitting: Family Medicine

## 2021-11-03 NOTE — Progress Notes (Deleted)
Cardiology Office Note:    Date:  11/03/2021   ID:  Kelly [name], DOB 23-Dec-1991, MRN [id number]  PCP:  Kerri Perches, MD   [facility] Providers Cardiologist:  None {    Referring MD: Kerri Perches, MD     History of Present Illness:    Kelly [name] is a 30 y.o. female with a hx of BPV and seasonal allergies who was referred by Dr. Lodema Hong for further evaluation of palpitations and intermittent left sided chest pain.  Per review of the record, the patient was seen at [facility] on 05/16/21 for palpitations. There, ECG revealed NSR with HR 82bpm and COVID negative. CTA without PE or acute pathology  Today, the patient states ***  Past Medical History:  Diagnosis Date   Allergy    penicillin   Situational anxiety 05/21/2021   Vertigo     Past Surgical History:  Procedure Laterality Date   ADENOIDECTOMY     TONSILLECTOMY     under 12     Current Medications: No outpatient medications have been marked as taking for the 11/17/21 encounter (Appointment) with Meriam Sprague, MD.     Allergies:   Penicillins   Social History   Socioeconomic History   Marital status: Single    Spouse name: Not on file   Number of children: 0   Years of education: 16   Highest education level: Not on file  Occupational History   Occupation: HR director    Comment: [employer]  Tobacco Use   Smoking status: Never   Smokeless tobacco: Never  Vaping Use   Vaping Use: Never used  Substance and Sexual Activity   Alcohol use: No   Drug use: No   Sexual activity: Yes    Birth control/protection: Condom  Other Topics Concern   Not on file  Social History Narrative   Graduated   Occupational hygienist for [employer]   Lives in [place] - lives alone   Social Determinants of Health   Financial Resource Strain: Low Risk    Difficulty of Paying Living Expenses: Not hard at all  Food Insecurity: No Food Insecurity   Worried About Brewing technologist in the Last Year: Never true   Ran Out of Food in the Last Year: Never true  Transportation Needs: No Transportation Needs   Lack of Transportation (Medical): No   Lack of Transportation (Non-Medical): No  Physical Activity: Insufficiently Active   Days of Exercise per Week: 2 days   Minutes of Exercise per Session: 60 min  Stress: No Stress Concern Present   Feeling of Stress : Only a little  Social Connections: Moderately Isolated   Frequency of Communication with Friends and Family: More than three times a week   Frequency of Social Gatherings with Friends and Family: More than three times a week   Attends Religious Services: More than 4 times per year   Active Member of [employer] or Organizations: No   Attends Engineer, structural: Patient refused   Marital Status: Never married     Family History: The patient's ***family history includes Hypertension in her father, maternal grandfather, maternal grandmother, mother, paternal grandfather, paternal grandmother, and sister.  ROS:   Please see the history of present illness.    *** All other systems reviewed and are negative.  EKGs/Labs/Other Studies Reviewed:    The following studies were reviewed today: CTA 2021/05/29: FINDINGS: Cardiovascular: There is no demonstrable  pulmonary embolus. There is no thoracic aortic aneurysm or dissection. Visualized great vessels appear unremarkable. No pericardial effusion or pericardial thickening evident.   Mediastinum/Nodes: Visualized thyroid appears unremarkable. No appreciable thoracic adenopathy. No esophageal lesions.   Lungs/Pleura: Lungs are clear. No pleural effusions. No pneumothorax. Trachea and major bronchial structures appear unremarkable.   Upper Abdomen: Visualized upper abdominal structures appear unremarkable.   Musculoskeletal: No blastic or lytic bone lesions. No evident chest wall lesions.   Review of the MIP images confirms the above findings.    IMPRESSION: 1. No evident pulmonary embolus. No thoracic aortic aneurysm or dissection.   2.  Lungs clear.   3.  No evident adenopathy.  EKG:  EKG is *** ordered today.  The ekg ordered today demonstrates ***  Recent Labs: 05/22/2021: ALT 10 05/23/2021: BUN 11; Creatinine, Ser 0.76; Hemoglobin 13.6; Platelets 304; Potassium 4.1; Sodium 135; TSH 2.000  Recent Lipid Panel    Component Value Date/Time   CHOL 215 (H) 03/11/2021 1423   TRIG 66 03/11/2021 1423   HDL 84 03/11/2021 1423   CHOLHDL 2.6 03/11/2021 1423   CHOLHDL 3.1 01/29/2020 1212   LDLCALC 119 (H) 03/11/2021 1423   LDLCALC 103 (H) 01/29/2020 1212     Risk Assessment/Calculations:   {Does this patient have ATRIAL FIBRILLATION?:[phone]}       Physical Exam:    VS:  There were no vitals taken for this visit.    Wt Readings from Last 3 Encounters:  08/05/21 122 lb (55.3 kg)  06/25/21 125 lb (56.7 kg)  05/27/21 119 lb 12.8 oz (54.3 kg)     GEN: *** Well nourished, well developed in no acute distress HEENT: Normal NECK: No JVD; No carotid bruits LYMPHATICS: No lymphadenopathy CARDIAC: ***RRR, no murmurs, rubs, gallops RESPIRATORY:  Clear to auscultation without rales, wheezing or rhonchi  ABDOMEN: Soft, non-tender, non-distended MUSCULOSKELETAL:  No edema; No deformity  SKIN: Warm and dry NEUROLOGIC:  Alert and oriented x 3 PSYCHIATRIC:  Normal affect   ASSESSMENT:    No diagnosis found. PLAN:    In order of problems listed above:  #Palpitations: Patient has been having intermittent episodes of palpitations that have been ongoing for several months. *** -Check zio  -?TTE -CTA without evidence of PE or acute pathology -Limit cafffeine -Increase hydration -?Mag -Stress reduction as possible      {Are you ordering a CV Procedure (e.g. stress test, cath, DCCV, TEE, etc)?   Press F2        :[id number]}    Medication Adjustments/Labs and Tests Ordered: Current medicines are reviewed at length  with the patient today.  Concerns regarding medicines are outlined above.  No orders of the defined types were placed in this encounter.  No orders of the defined types were placed in this encounter.   There are no Patient Instructions on file for this visit.   Signed, Meriam Sprague, MD  11/03/2021 7:59 AM    [facility]

## 2021-11-05 ENCOUNTER — Ambulatory Visit: Payer: BC Managed Care – PPO | Admitting: Family Medicine

## 2021-11-05 DIAGNOSIS — E559 Vitamin D deficiency, unspecified: Secondary | ICD-10-CM | POA: Diagnosis not present

## 2021-11-05 DIAGNOSIS — R42 Dizziness and giddiness: Secondary | ICD-10-CM | POA: Diagnosis not present

## 2021-11-05 DIAGNOSIS — Z13220 Encounter for screening for lipoid disorders: Secondary | ICD-10-CM | POA: Diagnosis not present

## 2021-11-06 ENCOUNTER — Ambulatory Visit (INDEPENDENT_AMBULATORY_CARE_PROVIDER_SITE_OTHER): Payer: BC Managed Care – PPO

## 2021-11-06 ENCOUNTER — Other Ambulatory Visit: Payer: Self-pay

## 2021-11-06 DIAGNOSIS — J309 Allergic rhinitis, unspecified: Secondary | ICD-10-CM

## 2021-11-06 LAB — LIPID PANEL
Chol/HDL Ratio: 2.7 ratio (ref 0.0–4.4)
Cholesterol, Total: 194 mg/dL (ref 100–199)
HDL: 71 mg/dL (ref >39)
LDL Chol Calc (NIH): 111 mg/dL — ABNORMAL HIGH (ref 0–99)
Triglycerides: 65 mg/dL (ref 0–149)
VLDL Cholesterol Cal: 12 mg/dL (ref 5–40)

## 2021-11-06 LAB — VITAMIN D 25 HYDROXY (VIT D DEFICIENCY, FRACTURES): Vit D, 25-Hydroxy: 43.5 ng/mL (ref 30.0–100.0)

## 2021-11-12 ENCOUNTER — Other Ambulatory Visit: Payer: Self-pay

## 2021-11-12 ENCOUNTER — Ambulatory Visit: Payer: BC Managed Care – PPO | Admitting: Family Medicine

## 2021-11-12 ENCOUNTER — Encounter: Payer: Self-pay | Admitting: Family Medicine

## 2021-11-12 VITALS — BP 124/74 | HR 77 | Resp 17 | Ht 64.0 in | Wt 120.0 lb

## 2021-11-12 DIAGNOSIS — L658 Other specified nonscarring hair loss: Secondary | ICD-10-CM | POA: Diagnosis not present

## 2021-11-12 DIAGNOSIS — Z1321 Encounter for screening for nutritional disorder: Secondary | ICD-10-CM | POA: Diagnosis not present

## 2021-11-12 DIAGNOSIS — R7989 Other specified abnormal findings of blood chemistry: Secondary | ICD-10-CM

## 2021-11-12 DIAGNOSIS — E785 Hyperlipidemia, unspecified: Secondary | ICD-10-CM

## 2021-11-12 DIAGNOSIS — L700 Acne vulgaris: Secondary | ICD-10-CM

## 2021-11-12 NOTE — Patient Instructions (Addendum)
F/u in 6 months, call if you need me sooner  Congrats on improved labs, keep it up!  Reduce cheese and any excess of nuts, butter and oils    Fasting CBC, lipid, cmp and EGFr, TSH and vit D 1 week before next visit  Please get flu vaccine and covid booster next week  When you finish the current once weekly vit D capsules please do NOT refill, instead start OTC vit D 3 1000 IU once daily

## 2021-11-13 ENCOUNTER — Ambulatory Visit (INDEPENDENT_AMBULATORY_CARE_PROVIDER_SITE_OTHER): Payer: BC Managed Care – PPO

## 2021-11-13 DIAGNOSIS — L658 Other specified nonscarring hair loss: Secondary | ICD-10-CM | POA: Diagnosis not present

## 2021-11-13 DIAGNOSIS — L700 Acne vulgaris: Secondary | ICD-10-CM | POA: Diagnosis not present

## 2021-11-13 DIAGNOSIS — J309 Allergic rhinitis, unspecified: Secondary | ICD-10-CM | POA: Diagnosis not present

## 2021-11-13 DIAGNOSIS — L089 Local infection of the skin and subcutaneous tissue, unspecified: Secondary | ICD-10-CM | POA: Diagnosis not present

## 2021-11-14 NOTE — Assessment & Plan Note (Signed)
Controlled, no change in medication  

## 2021-11-14 NOTE — Assessment & Plan Note (Signed)
Managed by dermatology and improving

## 2021-11-14 NOTE — Assessment & Plan Note (Signed)
improved

## 2021-11-14 NOTE — Assessment & Plan Note (Signed)
Improved Hyperlipidemia:Low fat diet discussed and encouraged.   Lipid Panel  Lab Results  Component Value Date   CHOL 194 11/05/2021   HDL 71 11/05/2021   LDLCALC 111 (H) 11/05/2021   TRIG 65 11/05/2021   CHOLHDL 2.7 11/05/2021

## 2021-11-14 NOTE — Progress Notes (Signed)
   DAVIS VANNATTER     MRN: [id number]      DOB: June 09, 1991   HPI Ms. Brockel is here for follow up and re-evaluation of chronic medical conditions, medication management and review of any available recent lab and radiology data.  Preventive health is updated, specifically  Cancer screening and Immunization.   Questions or concerns regarding consultations or procedures which the PT has had in the interim are  addressed. The PT denies any adverse reactions to current medications since the last visit.  There are no new concerns.  There are no specific complaints   ROS Denies recent fever or chills. Denies sinus pressure, nasal congestion, ear pain or sore throat. Denies chest congestion, productive cough or wheezing. Denies chest pains, palpitations and leg swelling Denies abdominal pain, nausea, vomiting,diarrhea or constipation.   Denies dysuria, frequency, hesitancy or incontinence. Denies joint pain, swelling and limitation in mobility. Denies headaches, seizures, numbness, or tingling. Denies depression, anxiety or insomnia. Denies skin break down or rash.   PE  BP 124/74   Pulse 77   Resp 17   Ht 5\' 4"  (1.626 m)   Wt 120 lb (54.4 kg)   SpO2 97%   BMI 20.60 kg/m   Patient alert and oriented and in no cardiopulmonary distress.  HEENT: No facial asymmetry, EOMI,     Neck supple .  Chest: Clear to auscultation bilaterally.  CVS: S1, S2 no murmurs, no S3.Regular rate.  ABD: Soft non tender.   Ext: No edema  MS: Adequate ROM spine, shoulders, hips and knees.  Skin: Intact, no ulcerations or rash noted.  Psych: Good eye contact, normal affect. Memory intact not anxious or depressed appearing.  CNS: CN 2-12 intact, power,  normal throughout.no focal deficits noted.   Assessment & Plan  Hyperlipidemia LDL goal <100 Improved Hyperlipidemia:Low fat diet discussed and encouraged.   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

## 2021-11-17 ENCOUNTER — Ambulatory Visit: Payer: BC Managed Care – PPO | Admitting: Cardiology

## 2021-11-18 ENCOUNTER — Other Ambulatory Visit: Payer: Self-pay

## 2021-11-18 ENCOUNTER — Ambulatory Visit (INDEPENDENT_AMBULATORY_CARE_PROVIDER_SITE_OTHER): Payer: BC Managed Care – PPO

## 2021-11-18 DIAGNOSIS — J309 Allergic rhinitis, unspecified: Secondary | ICD-10-CM

## 2021-11-27 ENCOUNTER — Ambulatory Visit (INDEPENDENT_AMBULATORY_CARE_PROVIDER_SITE_OTHER): Payer: BC Managed Care – PPO

## 2021-11-27 ENCOUNTER — Other Ambulatory Visit: Payer: Self-pay

## 2021-11-27 DIAGNOSIS — J309 Allergic rhinitis, unspecified: Secondary | ICD-10-CM | POA: Diagnosis not present

## 2021-12-04 ENCOUNTER — Ambulatory Visit (INDEPENDENT_AMBULATORY_CARE_PROVIDER_SITE_OTHER): Payer: BC Managed Care – PPO

## 2021-12-04 ENCOUNTER — Other Ambulatory Visit: Payer: Self-pay

## 2021-12-04 DIAGNOSIS — J309 Allergic rhinitis, unspecified: Secondary | ICD-10-CM | POA: Diagnosis not present

## 2021-12-11 ENCOUNTER — Ambulatory Visit (INDEPENDENT_AMBULATORY_CARE_PROVIDER_SITE_OTHER): Payer: BC Managed Care – PPO

## 2021-12-11 ENCOUNTER — Other Ambulatory Visit: Payer: Self-pay

## 2021-12-11 DIAGNOSIS — J309 Allergic rhinitis, unspecified: Secondary | ICD-10-CM | POA: Diagnosis not present

## 2021-12-18 ENCOUNTER — Ambulatory Visit (INDEPENDENT_AMBULATORY_CARE_PROVIDER_SITE_OTHER): Payer: BC Managed Care – PPO

## 2021-12-18 ENCOUNTER — Other Ambulatory Visit: Payer: Self-pay

## 2021-12-18 DIAGNOSIS — J309 Allergic rhinitis, unspecified: Secondary | ICD-10-CM

## 2021-12-25 ENCOUNTER — Ambulatory Visit (INDEPENDENT_AMBULATORY_CARE_PROVIDER_SITE_OTHER): Payer: BC Managed Care – PPO

## 2021-12-25 ENCOUNTER — Other Ambulatory Visit: Payer: Self-pay

## 2021-12-25 DIAGNOSIS — J309 Allergic rhinitis, unspecified: Secondary | ICD-10-CM

## 2022-01-01 ENCOUNTER — Other Ambulatory Visit: Payer: Self-pay

## 2022-01-01 ENCOUNTER — Ambulatory Visit (INDEPENDENT_AMBULATORY_CARE_PROVIDER_SITE_OTHER): Payer: BC Managed Care – PPO

## 2022-01-01 DIAGNOSIS — J309 Allergic rhinitis, unspecified: Secondary | ICD-10-CM | POA: Diagnosis not present

## 2022-01-08 ENCOUNTER — Other Ambulatory Visit: Payer: Self-pay

## 2022-01-08 ENCOUNTER — Ambulatory Visit (INDEPENDENT_AMBULATORY_CARE_PROVIDER_SITE_OTHER): Payer: BC Managed Care – PPO

## 2022-01-08 DIAGNOSIS — J309 Allergic rhinitis, unspecified: Secondary | ICD-10-CM | POA: Diagnosis not present

## 2022-01-15 ENCOUNTER — Other Ambulatory Visit: Payer: Self-pay

## 2022-01-15 ENCOUNTER — Ambulatory Visit (INDEPENDENT_AMBULATORY_CARE_PROVIDER_SITE_OTHER): Payer: BC Managed Care – PPO

## 2022-01-15 DIAGNOSIS — J309 Allergic rhinitis, unspecified: Secondary | ICD-10-CM | POA: Diagnosis not present

## 2022-01-22 ENCOUNTER — Ambulatory Visit (INDEPENDENT_AMBULATORY_CARE_PROVIDER_SITE_OTHER): Payer: BC Managed Care – PPO

## 2022-01-22 ENCOUNTER — Other Ambulatory Visit: Payer: Self-pay

## 2022-01-22 DIAGNOSIS — J309 Allergic rhinitis, unspecified: Secondary | ICD-10-CM | POA: Diagnosis not present

## 2022-01-29 ENCOUNTER — Ambulatory Visit (INDEPENDENT_AMBULATORY_CARE_PROVIDER_SITE_OTHER): Payer: BC Managed Care – PPO

## 2022-01-29 ENCOUNTER — Other Ambulatory Visit: Payer: Self-pay

## 2022-01-29 DIAGNOSIS — J309 Allergic rhinitis, unspecified: Secondary | ICD-10-CM | POA: Diagnosis not present

## 2022-01-30 ENCOUNTER — Telehealth: Payer: Self-pay | Admitting: Internal Medicine

## 2022-01-30 NOTE — Telephone Encounter (Signed)
Please call patient and have her schedule an office visit before her next injection.   Thank you.

## 2022-01-30 NOTE — Telephone Encounter (Signed)
Called and spoke with patient and advised of Dr. Elmyra Ricks recommendation. Patient verbalized understanding and stated that she has an appointment scheduled for February 14th and she will wait until then to discuss and hold off on her shots until then.

## 2022-01-30 NOTE — Telephone Encounter (Signed)
Late entry from 01/29/22 at 6:30 PM.  Received a phone call from Ms. Gladd stating she had developed generalized hives one hour following her allergy shots.  Patient had taken antihistamines the night before and morning of her allergy shot appointment.  Additionally, had taken 25 mg Benadryl 30 minutes prior to calling after hour provider.   Symptoms had been present for one hour at time of phone call.  She denied any other systemic symptoms.  Discussed symptomatic care with additional antihistamines for hives with instructions to use epinephrine auto injector should she develop any other systemic symptoms.  Called her this morning to check-in, and hives had resolved overnight.   She is coming weekly, received 0.15 mL of red vial yesterday.  She does want to continue injections.  Advised her that I would contact her primary provider to discuss slowing down/backing up her build-up.

## 2022-02-09 NOTE — Progress Notes (Signed)
Follow Up Note  RE: Kelly [name] MRN: [id number] DOB: 07/23/1991 Date of Office Visit: 02/10/2022  Referring provider: Kerri Perches, MD Primary care provider: Kerri Perches, MD  Chief Complaint: Allergic Reaction (To shots)  History of Present Illness: I had the pleasure of seeing Kelly [name] for a follow up visit at the [facility] on 02/10/2022. She is a 31 y.o. female, who is being followed for allergic rhino conjunctivitis on AIT, eustachian tube dysfunction. Her previous allergy office visit was on 08/07/2021 with Dr. Selena Batten. Today is a regular follow up visit.  Seasonal and perennial allergic rhinoconjunctivitis Started AIT in September 2022 (  Patient got last injection on 2/2 red 0.15. she went home and about 30-60 minutes afterwards she noticed hives on her upper legs, arms, ears.   She took benadryl 50mg  and Claritin 10mg  and symptoms resolved within 2 hours. Denies any respiratory compromise.  On 12/22 green 0.59mL, 1 hour later she had lip swelling and hives on her chin.  She took benadryl 50mg  and Claritin 10mg  and symptoms resolved by the next day.  Currently taking Singulair 10mg  daily at night. Takes Claritin as needed. The day before injection she takes benadryl at night and Claritin around lunch time.  She has noticed some improvement on the AIT. Stopped Flonase.  No eye drop use.  Has Epipen.   Assessment and Plan: Kelly [name] is a 31 y.o. female with: Seasonal and perennial allergic rhinoconjunctivitis Past history - Rhinoconjunctivitis symptoms for 20+ years mainly in the spring and fall.  Noticed symptoms this winter as well.  Zyrtec caused nosebleeds. 2022 skin testing showed: Positive to grass pollen, weed, ragweed, trees, mold.  Interim history - Started AIT on 08/28/2021 (G-Rw-W-T & M). Last injection with red 0.42mL caused whole body urticaria. Resolved with benadryl and Claritin. Denies any respiratory symptoms. She  also had lip swelling with green 0.66mL. Usually takes Singulair daily, benadryl the night before injection and Claritin the day of injection only. Continue environmental control measures.  Continue allergy injections - come back on Thursday as she didn't premedicate today. Will restart red vial at 0.26mL. Make sure to wait 30 minutes after injections.  Double up on the antihistamines the day before, the day of and the day after injection. You can take Zyrtec, Claritin, Allegra or Xyzal.  May use topical hydrocortisone cream as needed on the rash.  For mild symptoms you can take over the counter antihistamines such as Benadryl and monitor symptoms closely. If symptoms worsen or if you have severe symptoms including breathing issues, throat closure, significant swelling, whole body hives, severe diarrhea and vomiting, lightheadedness then inject epinephrine and seek immediate medical care afterwards. Use Flonase (fluticasone) nasal spray 1 spray per nostril twice a day as needed for nasal congestion.  Nasal saline spray (i.e., Simply Saline) or nasal saline lavage (i.e., NeilMed) is recommended as needed and prior to medicated nasal sprays. Continue Singulair (montelukast) 10mg  daily at night. May use olopatadine eye drops 0.2% once a day as needed for itchy/watery eyes.  Return in about 6 months (around 08/10/2022).  No orders of the defined types were placed in this encounter.  Lab Orders  No laboratory test(s) ordered today    Diagnostics: None.   Medication List:  Current Outpatient Medications  Medication Sig Dispense Refill   EPINEPHrine 0.3 mg/0.3 mL IJ SOAJ injection Inject 0.3 mg into the muscle as needed for anaphylaxis. 1 each 2   fluticasone (  FLONASE) 50 MCG/ACT nasal spray Use 2 sprays in each nostril BID for a week. After 1 week, decrease to 1 spray in each nostril BID as needed for congestion/allergies. 16 g 6   montelukast (SINGULAIR) 10 MG tablet TAKE 1 TABLET BY MOUTH  EVERYDAY AT BEDTIME 90 tablet 1   spironolactone (ALDACTONE) 50 MG tablet SMARTSIG:1 Pill By Mouth Every Morning     tretinoin (RETIN-A) 0.025 % cream SMARTSIG:Topical Every Evening     clobetasol (TEMOVATE) 0.05 % external solution Apply topically. (Patient not taking: Reported on 02/10/2022)     No current facility-administered medications for this visit.   Allergies: Allergies  Allergen Reactions   Penicillins Rash   I reviewed her past medical history, social history, family history, and environmental history and no significant changes have been reported from her previous visit.  Review of Systems  Constitutional:  Negative for appetite change, chills, fever and unexpected weight change.  HENT:  Negative for congestion, rhinorrhea and sneezing.   Eyes:  Negative for itching.  Respiratory:  Negative for cough, chest tightness, shortness of breath and wheezing.   Cardiovascular:  Negative for chest pain.  Gastrointestinal:  Negative for abdominal pain.  Genitourinary:  Negative for difficulty urinating.  Skin:  Negative for rash.  Allergic/Immunologic: Positive for environmental allergies.  Neurological:  Negative for headaches.   Objective: BP 110/72    Pulse 72    Temp 98.3 F (36.8 C) (Temporal)    Resp 18    SpO2 98%  There is no height or weight on file to calculate BMI. Physical Exam Vitals and nursing note reviewed.  Constitutional:      Appearance: Normal appearance. She is well-developed.  HENT:     Head: Normocephalic and atraumatic.     Right Ear: Tympanic membrane and external ear normal.     Left Ear: Tympanic membrane and external ear normal.     Nose: Nose normal.     Mouth/Throat:     Mouth: Mucous membranes are moist.     Pharynx: Oropharynx is clear.  Eyes:     Conjunctiva/sclera: Conjunctivae normal.  Cardiovascular:     Rate and Rhythm: Normal rate and regular rhythm.     Heart sounds: Normal heart sounds. No murmur heard.   No friction rub. No  gallop.  Pulmonary:     Effort: Pulmonary effort is normal.     Breath sounds: Normal breath sounds. No wheezing, rhonchi or rales.  Musculoskeletal:     Cervical back: Neck supple.  Skin:    General: Skin is warm.     Findings: No rash.  Neurological:     Mental Status: She is alert and oriented to person, place, and time.  Psychiatric:        Behavior: Behavior normal.   Previous notes and tests were reviewed. The plan was reviewed with the patient/family, and all questions/concerned were addressed.  It was my pleasure to see Kelly [name] today and participate in her care. Please feel free to contact me with any questions or concerns.  Sincerely,  Wyline Mood, DO Allergy & Immunology  [facility] office: [phone] [facility] office: [phone]

## 2022-02-10 ENCOUNTER — Ambulatory Visit: Payer: BC Managed Care – PPO

## 2022-02-10 ENCOUNTER — Other Ambulatory Visit: Payer: Self-pay

## 2022-02-10 ENCOUNTER — Ambulatory Visit: Payer: BC Managed Care – PPO | Admitting: Allergy

## 2022-02-10 ENCOUNTER — Encounter: Payer: Self-pay | Admitting: Allergy

## 2022-02-10 VITALS — BP 110/72 | HR 72 | Temp 98.3°F | Resp 18

## 2022-02-10 DIAGNOSIS — J3089 Other allergic rhinitis: Secondary | ICD-10-CM | POA: Diagnosis not present

## 2022-02-10 DIAGNOSIS — H1013 Acute atopic conjunctivitis, bilateral: Secondary | ICD-10-CM

## 2022-02-10 DIAGNOSIS — H1010 Acute atopic conjunctivitis, unspecified eye: Secondary | ICD-10-CM

## 2022-02-10 DIAGNOSIS — H6982 Other specified disorders of Eustachian tube, left ear: Secondary | ICD-10-CM

## 2022-02-10 NOTE — Patient Instructions (Addendum)
Environmental allergies 2022 skin testing showed: Positive to grass pollen, weed, ragweed, trees, mold. Continue environmental control measures.  Continue allergy injections - come back on Thursday.  Make sure to wait 30 minutes after injections.  Double up on the antihistamines the day before, the day of and the day after injection. You can take Zyrtec, Claritin, Allegra or Xyzal.  May use topical hydrocortisone cream as needed on the rash.  For mild symptoms you can take over the counter antihistamines such as Benadryl and monitor symptoms closely. If symptoms worsen or if you have severe symptoms including breathing issues, throat closure, significant swelling, whole body hives, severe diarrhea and vomiting, lightheadedness then inject epinephrine and seek immediate medical care afterwards.  Use Flonase (fluticasone) nasal spray 1 spray per nostril twice a day as needed for nasal congestion.  Nasal saline spray (i.e., Simply Saline) or nasal saline lavage (i.e., NeilMed) is recommended as needed and prior to medicated nasal sprays. Continue Singulair (montelukast) 10mg  daily at night. May use olopatadine eye drops 0.2% once a day as needed for itchy/watery eyes.  Follow up in 6 months or sooner if needed.

## 2022-02-10 NOTE — Assessment & Plan Note (Signed)
Past history - Rhinoconjunctivitis symptoms for 20+ years mainly in the spring and fall.  Noticed symptoms this winter as well.  Zyrtec caused nosebleeds. 2022 skin testing showed: Positive to grass pollen, weed, ragweed, trees, mold.  Interim history - Started AIT on 08/28/2021 (G-Rw-W-T & M). Last injection with red 0.50mL caused whole body urticaria. Resolved with benadryl and Claritin. Denies any respiratory symptoms. She also had lip swelling with green 0.55mL. Usually takes Singulair daily, benadryl the night before injection and Claritin the day of injection only.  Continue environmental control measures.   Continue allergy injections - come back on Thursday as she didn't premedicate today.  Will restart red vial at 0.39mL.  Make sure to wait 30 minutes after injections.   Double up on the antihistamines the day before, the day of and the day after injection.  You can take Zyrtec, Claritin, Allegra or Xyzal.   May use topical hydrocortisone cream as needed on the rash.   For mild symptoms you can take over the counter antihistamines such as Benadryl and monitor symptoms closely. If symptoms worsen or if you have severe symptoms including breathing issues, throat closure, significant swelling, whole body hives, severe diarrhea and vomiting, lightheadedness then inject epinephrine and seek immediate medical care afterwards.  Use Flonase (fluticasone) nasal spray 1 spray per nostril twice a day as needed for nasal congestion.   Nasal saline spray (i.e., Simply Saline) or nasal saline lavage (i.e., NeilMed) is recommended as needed and prior to medicated nasal sprays.  Continue Singulair (montelukast) 10mg  daily at night.  May use olopatadine eye drops 0.2% once a day as needed for itchy/watery eyes.

## 2022-02-12 ENCOUNTER — Other Ambulatory Visit: Payer: Self-pay

## 2022-02-12 ENCOUNTER — Ambulatory Visit (INDEPENDENT_AMBULATORY_CARE_PROVIDER_SITE_OTHER): Payer: BC Managed Care – PPO | Admitting: *Deleted

## 2022-02-12 DIAGNOSIS — J309 Allergic rhinitis, unspecified: Secondary | ICD-10-CM

## 2022-02-19 ENCOUNTER — Ambulatory Visit (INDEPENDENT_AMBULATORY_CARE_PROVIDER_SITE_OTHER): Payer: BC Managed Care – PPO

## 2022-02-19 ENCOUNTER — Other Ambulatory Visit: Payer: Self-pay

## 2022-02-19 DIAGNOSIS — J309 Allergic rhinitis, unspecified: Secondary | ICD-10-CM | POA: Diagnosis not present

## 2022-02-26 ENCOUNTER — Other Ambulatory Visit: Payer: Self-pay

## 2022-02-26 ENCOUNTER — Ambulatory Visit (INDEPENDENT_AMBULATORY_CARE_PROVIDER_SITE_OTHER): Payer: BC Managed Care – PPO

## 2022-02-26 DIAGNOSIS — J309 Allergic rhinitis, unspecified: Secondary | ICD-10-CM

## 2022-03-05 ENCOUNTER — Ambulatory Visit (INDEPENDENT_AMBULATORY_CARE_PROVIDER_SITE_OTHER): Payer: BC Managed Care – PPO

## 2022-03-05 DIAGNOSIS — J309 Allergic rhinitis, unspecified: Secondary | ICD-10-CM

## 2022-03-10 ENCOUNTER — Ambulatory Visit (INDEPENDENT_AMBULATORY_CARE_PROVIDER_SITE_OTHER): Payer: BC Managed Care – PPO

## 2022-03-10 ENCOUNTER — Other Ambulatory Visit: Payer: Self-pay

## 2022-03-10 DIAGNOSIS — J309 Allergic rhinitis, unspecified: Secondary | ICD-10-CM

## 2022-03-16 ENCOUNTER — Encounter: Payer: Self-pay | Admitting: Allergy

## 2022-03-17 ENCOUNTER — Other Ambulatory Visit: Payer: Self-pay | Admitting: *Deleted

## 2022-03-17 MED ORDER — OLOPATADINE HCL 0.2 % OP SOLN: 1.0000 [drp] | Freq: Every day | OPHTHALMIC | 5 refills | Status: DC | PRN

## 2022-03-24 ENCOUNTER — Encounter: Payer: Self-pay | Admitting: Family Medicine

## 2022-03-24 ENCOUNTER — Other Ambulatory Visit: Payer: Self-pay

## 2022-03-24 ENCOUNTER — Ambulatory Visit (INDEPENDENT_AMBULATORY_CARE_PROVIDER_SITE_OTHER): Payer: BC Managed Care – PPO

## 2022-03-24 DIAGNOSIS — J309 Allergic rhinitis, unspecified: Secondary | ICD-10-CM | POA: Diagnosis not present

## 2022-03-31 ENCOUNTER — Ambulatory Visit (INDEPENDENT_AMBULATORY_CARE_PROVIDER_SITE_OTHER): Payer: BC Managed Care – PPO

## 2022-03-31 DIAGNOSIS — J309 Allergic rhinitis, unspecified: Secondary | ICD-10-CM

## 2022-04-01 DIAGNOSIS — E785 Hyperlipidemia, unspecified: Secondary | ICD-10-CM | POA: Diagnosis not present

## 2022-04-01 DIAGNOSIS — Z1321 Encounter for screening for nutritional disorder: Secondary | ICD-10-CM | POA: Diagnosis not present

## 2022-04-02 LAB — CBC
Hematocrit: 39.2 % (ref 34.0–46.6)
Hemoglobin: 13.2 g/dL (ref 11.1–15.9)
MCH: 32 pg (ref 26.6–33.0)
MCHC: 33.7 g/dL (ref 31.5–35.7)
MCV: 95 fL (ref 79–97)
Platelets: 306 10*3/uL (ref 150–450)
RBC: 4.12 x10E6/uL (ref 3.77–5.28)
RDW: 11.4 % — ABNORMAL LOW (ref 11.7–15.4)
WBC: 4 10*3/uL (ref 3.4–10.8)

## 2022-04-02 LAB — LIPID PANEL
Chol/HDL Ratio: 2.6 ratio (ref 0.0–4.4)
Cholesterol, Total: 194 mg/dL (ref 100–199)
HDL: 74 mg/dL (ref >39)
LDL Chol Calc (NIH): 112 mg/dL — ABNORMAL HIGH (ref 0–99)
Triglycerides: 40 mg/dL (ref 0–149)
VLDL Cholesterol Cal: 8 mg/dL (ref 5–40)

## 2022-04-02 LAB — CMP14+EGFR
ALT: 8 IU/L (ref 0–32)
AST: 15 IU/L (ref 0–40)
Albumin/Globulin Ratio: 1.4 (ref 1.2–2.2)
Albumin: 4.5 g/dL (ref 3.9–5.0)
Alkaline Phosphatase: 38 IU/L — ABNORMAL LOW (ref 44–121)
BUN/Creatinine Ratio: 9 (ref 9–23)
BUN: 7 mg/dL (ref 6–20)
Bilirubin Total: 0.5 mg/dL (ref 0.0–1.2)
CO2: 24 mmol/L (ref 20–29)
Calcium: 9.6 mg/dL (ref 8.7–10.2)
Chloride: 98 mmol/L (ref 96–106)
Creatinine, Ser: 0.75 mg/dL (ref 0.57–1.00)
Globulin, Total: 3.3 g/dL (ref 1.5–4.5)
Glucose: 84 mg/dL (ref 70–99)
Potassium: 4.1 mmol/L (ref 3.5–5.2)
Sodium: 138 mmol/L (ref 134–144)
Total Protein: 7.8 g/dL (ref 6.0–8.5)
eGFR: 110 mL/min/{1.73_m2} (ref >59)

## 2022-04-02 LAB — TSH: TSH: 1.24 u[IU]/mL (ref 0.450–4.500)

## 2022-04-02 LAB — VITAMIN D 25 HYDROXY (VIT D DEFICIENCY, FRACTURES): Vit D, 25-Hydroxy: 23.2 ng/mL — ABNORMAL LOW (ref 30.0–100.0)

## 2022-04-07 ENCOUNTER — Ambulatory Visit (INDEPENDENT_AMBULATORY_CARE_PROVIDER_SITE_OTHER): Payer: BC Managed Care – PPO

## 2022-04-07 DIAGNOSIS — J309 Allergic rhinitis, unspecified: Secondary | ICD-10-CM

## 2022-04-14 ENCOUNTER — Ambulatory Visit (INDEPENDENT_AMBULATORY_CARE_PROVIDER_SITE_OTHER): Payer: BC Managed Care – PPO

## 2022-04-14 DIAGNOSIS — J309 Allergic rhinitis, unspecified: Secondary | ICD-10-CM

## 2022-04-23 ENCOUNTER — Ambulatory Visit (INDEPENDENT_AMBULATORY_CARE_PROVIDER_SITE_OTHER): Payer: BC Managed Care – PPO

## 2022-04-23 DIAGNOSIS — J309 Allergic rhinitis, unspecified: Secondary | ICD-10-CM | POA: Diagnosis not present

## 2022-04-28 ENCOUNTER — Ambulatory Visit (INDEPENDENT_AMBULATORY_CARE_PROVIDER_SITE_OTHER): Payer: BC Managed Care – PPO

## 2022-04-28 DIAGNOSIS — J309 Allergic rhinitis, unspecified: Secondary | ICD-10-CM | POA: Diagnosis not present

## 2022-04-29 DIAGNOSIS — J302 Other seasonal allergic rhinitis: Secondary | ICD-10-CM | POA: Diagnosis not present

## 2022-04-29 NOTE — Progress Notes (Signed)
VIALS EXP 04-30-23 ?

## 2022-04-30 DIAGNOSIS — L700 Acne vulgaris: Secondary | ICD-10-CM | POA: Diagnosis not present

## 2022-04-30 DIAGNOSIS — L658 Other specified nonscarring hair loss: Secondary | ICD-10-CM | POA: Diagnosis not present

## 2022-04-30 DIAGNOSIS — L089 Local infection of the skin and subcutaneous tissue, unspecified: Secondary | ICD-10-CM | POA: Diagnosis not present

## 2022-05-03 ENCOUNTER — Other Ambulatory Visit: Payer: Self-pay | Admitting: Allergy

## 2022-05-07 ENCOUNTER — Ambulatory Visit (INDEPENDENT_AMBULATORY_CARE_PROVIDER_SITE_OTHER): Payer: BC Managed Care – PPO | Admitting: *Deleted

## 2022-05-07 DIAGNOSIS — J309 Allergic rhinitis, unspecified: Secondary | ICD-10-CM | POA: Diagnosis not present

## 2022-05-12 ENCOUNTER — Ambulatory Visit: Payer: BC Managed Care – PPO | Admitting: Family Medicine

## 2022-05-12 ENCOUNTER — Ambulatory Visit (INDEPENDENT_AMBULATORY_CARE_PROVIDER_SITE_OTHER): Payer: BC Managed Care – PPO | Admitting: *Deleted

## 2022-05-12 DIAGNOSIS — J309 Allergic rhinitis, unspecified: Secondary | ICD-10-CM | POA: Diagnosis not present

## 2022-05-13 ENCOUNTER — Encounter: Payer: Self-pay | Admitting: Family Medicine

## 2022-05-21 ENCOUNTER — Other Ambulatory Visit: Payer: Self-pay | Admitting: Family Medicine

## 2022-05-21 ENCOUNTER — Ambulatory Visit (INDEPENDENT_AMBULATORY_CARE_PROVIDER_SITE_OTHER): Payer: BC Managed Care – PPO

## 2022-05-21 ENCOUNTER — Ambulatory Visit: Payer: BC Managed Care – PPO | Admitting: Family Medicine

## 2022-05-21 ENCOUNTER — Encounter: Payer: Self-pay | Admitting: Family Medicine

## 2022-05-21 VITALS — BP 109/53 | HR 82 | Ht 64.0 in | Wt 116.1 lb

## 2022-05-21 DIAGNOSIS — F419 Anxiety disorder, unspecified: Secondary | ICD-10-CM | POA: Insufficient documentation

## 2022-05-21 DIAGNOSIS — L700 Acne vulgaris: Secondary | ICD-10-CM | POA: Diagnosis not present

## 2022-05-21 DIAGNOSIS — J309 Allergic rhinitis, unspecified: Secondary | ICD-10-CM | POA: Diagnosis not present

## 2022-05-21 DIAGNOSIS — F322 Major depressive disorder, single episode, severe without psychotic features: Secondary | ICD-10-CM | POA: Diagnosis not present

## 2022-05-21 HISTORY — DX: Major depressive disorder, single episode, severe without psychotic features: F32.2

## 2022-05-21 MED ORDER — ESCITALOPRAM OXALATE 10 MG PO TABS: 10.0000 mg | ORAL_TABLET | Freq: Every day | ORAL | 0 refills | Status: DC

## 2022-05-21 MED ORDER — HYDROXYZINE PAMOATE 25 MG PO CAPS: ORAL_CAPSULE | ORAL | 0 refills | Status: DC

## 2022-05-21 NOTE — Assessment & Plan Note (Signed)
Continue management by Payton Mccallum, doing well

## 2022-05-21 NOTE — Assessment & Plan Note (Addendum)
Severe depression, incapable of function, start sSRI and bedtime hydroxyzine , needs urgent Psych appt, incapable of working currently and I anticipate an extended period of leave with therapy is needed. Urgenmt Psych eval Woprk excuse  X 2 weeks, anticipating Psych management

## 2022-05-21 NOTE — Progress Notes (Signed)
   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  HEENT: No facial asymmetry, EOMI,     Neck supple .  Chest: Clear to auscultation bilaterally.  CVS: S1, S2 no murmurs, no S3.Regular rate.  Ext: No edema  MS: Adequate ROM spine, shoulders, hips and knees.  Skin: Intact, no ulcerations or rash noted. Tearful, crying, o and depressed appearing.  CNS: CN 2-12 intact, power,  normal throughout.no focal deficits noted.   Assessment & Plan  Depression, major, single episode, severe (HCC) Severe depression, incapable of function, start sSRI and bedtime hydroxyzine , needs urgent Psych appt, incapable of working currently and I anticipate an extended period of leave with therapy is needed. Urgenmt Psych eval Woprk excuse  X 2 weeks, anticipating Psych management   Anxiety Incapable of functioning, work excuse , medicaion and Psych referral urgently  Acne vulgaris Continue management by 07/01/2022, doing well

## 2022-05-21 NOTE — Patient Instructions (Addendum)
F/U in 8 weeks, call if you need me sooner  Work excuse from today to return June 8, however , I believe that you need Psychiatry urgently to treat your depression and anxiety , and anticipate a more extended time from work once they assume your care  We will be in touch with appointment information as soon as possible  Medication is prescribed for depression, anxiety and sleep to start today  You will improve ,. Now that you are getting help  Thanks for choosing [facility], we consider it a privelige to serve you.

## 2022-05-21 NOTE — Assessment & Plan Note (Signed)
Incapable of functioning, work excuse , medicaion and Psych referral urgently

## 2022-05-28 ENCOUNTER — Ambulatory Visit (INDEPENDENT_AMBULATORY_CARE_PROVIDER_SITE_OTHER): Payer: BC Managed Care – PPO | Admitting: *Deleted

## 2022-05-28 ENCOUNTER — Ambulatory Visit: Payer: BC Managed Care – PPO | Admitting: Family Medicine

## 2022-05-28 DIAGNOSIS — J309 Allergic rhinitis, unspecified: Secondary | ICD-10-CM | POA: Diagnosis not present

## 2022-05-28 NOTE — Progress Notes (Unsigned)
Virtual Visit via Video Note  I connected with Kelly [name] Fadness on 05/30/22 at  8:00 AM EDT by a video enabled telemedicine application and verified that I am speaking with the correct person using two identifiers.  Location: Patient: home Provider: office Persons participated in the visit- patient, provider    I discussed the limitations of evaluation and management by telemedicine and the availability of in person appointments. The patient expressed understanding and agreed to proceed.   I discussed the assessment and treatment plan with the patient. The patient was provided an opportunity to ask questions and all were answered. The patient agreed with the plan and demonstrated an understanding of the instructions.   The patient was advised to call back or seek an in-person evaluation if the symptoms worsen or if the condition fails to improve as anticipated.  I provided 30 minutes of non-face-to-face time during this encounter.   Neysa Hottereina Hildegard Hlavac, MD     Psychiatric Initial Adult Assessment   Patient Identification: Kelly [name] Sulak MRN:  [id number] Date of Evaluation:  05/30/2022 Referral Source: Kerri PerchesSimpson, Margaret E, MD  Chief Complaint:  No chief complaint on file.  Visit Diagnosis:    ICD-10-CM   1. Current severe episode of major depressive disorder without psychotic features without prior episode [facility](HCC)  F32.2 Ambulatory referral to Psychology      History of Present Illness:   Kelly [name] Terrien is a 31 y.o. year old female with a history of depression, anxiety, who is referred for depression.   She states that she has insomnia, and appetite loss for the past few weeks since issues at work.  She has an open case of discrimination at work.  She talks about the comment made by the founder of the company that she will not be suitable for the certain position as she is a woman, young and an African-American.  She also states that she has been harassed at work since the case is open.   She has been asked to quit the job, and they are concerned about her job performance.  She does not think she has issues with job performance, stating that she had even created HR in the company, and she was the only 1 who did the job in the past. It has been difficult for her to work with others due to the current situation.  Although she is currently on leave, she does not think she can return to work next week due to the way she feels. She constantly thinks about the situation at work.  She has depressive symptoms as in PHQ-9.  She denies SI.  She tends to stay in the house most of the time lately.  She reports good support from her mother, sister and her boyfriend.  She is willing to try going outside more and have a daily routine.   Medication- escitalopram 10 mg daily, hydroxyzine 25 daily as needed for anxiety, insomnia  Support:mother, sister, boyfriend Household: by herself Marital status:single Number of children: 0  Employment: HR of six years Education:   Last PCP / ongoing medical evaluation:   She describes her childhood as good.  She reports good relationship with her parents and her sister.   Associated Signs/Symptoms: Depression Symptoms:  depressed mood, anhedonia, insomnia, fatigue, difficulty concentrating, anxiety, (Hypo) Manic Symptoms:   denies decreased need for sleep, euphoria Anxiety Symptoms:   mild anxiety Psychotic Symptoms:   denies AH, VH, paranoia PTSD Symptoms: Had a traumatic exposure:  descrimination, being harassed  at work Re-experiencing:  Flashbacks Intrusive Thoughts Nightmares Hypervigilance:  No Hyperarousal:  Difficulty Concentrating Sleep Avoidance:  Decreased Interest/Participation  Past Psychiatric History:  Outpatient: denies Psychiatry admission: denies Previous suicide attempt: denies  Past trials of medication:  History of violence:    Previous Psychotropic Medications: Yes   Substance Abuse History in the last 12 months:   No.  Consequences of Substance Abuse: NA  Past Medical History:  Past Medical History:  Diagnosis Date   Allergy    penicillin   Depression, major, single episode, severe (HCC) 05/21/2022   Situational anxiety 05/21/2021   Vertigo     Past Surgical History:  Procedure Laterality Date   ADENOIDECTOMY     TONSILLECTOMY     under 12     Family Psychiatric History: as below  Family History:  Family History  Problem Relation Age of Onset   Hypertension Mother    Anxiety disorder Father    Hypertension Father    Hypertension Sister    Hypertension Maternal Grandfather    Hypertension Maternal Grandmother    Hypertension Paternal Grandfather    Hypertension Paternal Grandmother     Social History:   Social History   Socioeconomic History   Marital status: Single    Spouse name: Not on file   Number of children: 0   Years of education: 16   Highest education level: Not on file  Occupational History   Occupation: HR Interior and spatial designer    Comment: [employer]  Tobacco Use   Smoking status: Never   Smokeless tobacco: Never  Vaping Use   Vaping Use: Never used  Substance and Sexual Activity   Alcohol use: No   Drug use: No   Sexual activity: Yes    Birth control/protection: Condom  Other Topics Concern   Not on file  Social History Narrative   Graduated   Occupational hygienist for [employer]   Lives in [place] - lives alone   Social Determinants of Health   Financial Resource Strain: Not on file  Food Insecurity: Not on file  Transportation Needs: Not on file  Physical Activity: Not on file  Stress: Not on file  Social Connections: Not on file    Additional Social History: as above  Allergies:   Allergies  Allergen Reactions   Penicillins Rash    Metabolic Disorder Labs: No results found for: HGBA1C, MPG No results found for: PROLACTIN Lab Results  Component Value Date   CHOL 194 04/01/2022   TRIG 40 04/01/2022   HDL 74 04/01/2022   CHOLHDL 2.6  04/01/2022   LDLCALC 112 (H) 04/01/2022   LDLCALC 111 (H) 11/05/2021   Lab Results  Component Value Date   TSH 1.240 04/01/2022    Therapeutic Level Labs: No results found for: LITHIUM No results found for: CBMZ No results found for: VALPROATE  Current Medications: Current Outpatient Medications  Medication Sig Dispense Refill   clobetasol (TEMOVATE) 0.05 % external solution Apply topically.     doxycycline (VIBRA-TABS) 100 MG tablet Take 1 tablet (100 mg total) by mouth daily. 30 tablet 11   EPINEPHrine 0.3 mg/0.3 mL IJ SOAJ injection Inject 0.3 mg into the muscle as needed for anaphylaxis. 1 each 2   escitalopram (LEXAPRO) 10 MG tablet Take 1 tablet (10 mg total) by mouth daily. 30 tablet 0   fluticasone (FLONASE) 50 MCG/ACT nasal spray Use 2 sprays in each nostril BID for a week. After 1 week, decrease to 1 spray in each nostril BID  as needed for congestion/allergies. 16 g 6   hydrOXYzine (VISTARIL) 25 MG capsule Take 1 capsule (25 mg total) by mouth daily as needed for anxiety. 30 capsule 0   montelukast (SINGULAIR) 10 MG tablet TAKE 1 TABLET BY MOUTH EVERYDAY AT BEDTIME 90 tablet 0   Olopatadine HCl 0.2 % SOLN Apply 1 drop to eye daily as needed. 2.5 mL 5   spironolactone (ALDACTONE) 50 MG tablet SMARTSIG:1 Pill By Mouth Every Morning     tretinoin (RETIN-A) 0.025 % cream SMARTSIG:Topical Every Evening     No current facility-administered medications for this visit.    Musculoskeletal: Strength & Muscle Tone:  N/A Gait & Station:  N/A Patient leans: N/A  Psychiatric Specialty Exam: Review of Systems  Psychiatric/Behavioral:  Positive for decreased concentration, dysphoric mood and sleep disturbance. Negative for agitation, behavioral problems, confusion, hallucinations, self-injury and suicidal ideas. The patient is nervous/anxious. The patient is not hyperactive.   All other systems reviewed and are negative.  Last menstrual period 05/01/2022.There is no height or weight  on file to calculate BMI.  General Appearance: Fairly Groomed  Eye Contact:  Good  Speech:  Clear and Coherent  Volume:  Normal  Mood:  Depressed  Affect:  Appropriate, Congruent, and Tearful  Thought Process:  Coherent  Orientation:  Full (Time, Place, and Person)  Thought Content:  Logical  Suicidal Thoughts:  No  Homicidal Thoughts:  No  Memory:  Immediate;   Good  Judgement:  Good  Insight:  Good  Psychomotor Activity:  Normal  Concentration:  Concentration: Good and Attention Span: Good  Recall:  Good  Fund of Knowledge:Good  Language: Good  Akathisia:  No  Handed:  Right  AIMS (if indicated):  not done  Assets:  Communication Skills Desire for Improvement  ADL's:  Intact  Cognition: WNL  Sleep:  Poor   Screenings: GAD-7    Flowsheet Row Office Visit from 05/21/2022 in [facility] Office Visit from 05/27/2021 in [facility] Office Visit from 05/21/2021 in [facility] Office Visit from 04/15/2021 in [facility] Office Visit from 01/19/2019 in [facility]  Total GAD-7 Score 19 2 4 4  0      PHQ2-9    Flowsheet Row Video Visit from 05/30/2022 in [facility] Office Visit from 05/21/2022 in [facility] Office Visit from 11/12/2021 in [facility] Office Visit from 08/05/2021 in [facility] Office Visit from 06/25/2021 in [facility]  PHQ-2 Total Score 6 6 0 0 0  PHQ-9 Total Score 22 21 0 -- --      Flowsheet Row ED from 05/23/2021 in [facility] EM[facility]GENCY [facility] ED from 05/22/2021 in [facility] EM[facility]PARTMENT  C-SSRS RISK CATEGORY No Risk No Risk       Assessment and Plan:  SHARIFA BUCHOLZ is a 30 y.o. year old female with a history of depression, anxiety, who is referred for depression.   1. MDD (major depressive disorder), single episode, severe (HCC) She reports symptoms of depression for the past few weeks in  the context of issues at work/filing a case for discrimination.  She was started on Lexapro by her PCP, and she has tolerated it without any side effect.  After having discussed treatment options, she prefers to stay on the current medication regimen.  Will continue Lexapro to target depression.  Will continue hydroxyzine as needed for anxiety.   Plan Start lexapro 10 mg daily  Continue hydroxyzine 25 mg at night as needed for insomnia/anxiety Referral to therapy Next appointment- 6/29 at 4 PM for 30 mins, video   The patient demonstrates the following risk factors for suicide: Chronic risk factors for suicide include: psychiatric disorder of depression . Acute risk factors for suicide include: loss (financial, interpersonal, professional). Protective factors for this patient include: positive social support, coping skills, and hope for the future. Considering these factors, the overall suicide risk at this point appears to be low. Patient is appropriate for outpatient follow up.   Collaboration of Care: Other review PCP note  Patient/Guardian was advised Release of Information must be obtained prior to any record release in order to collaborate their care with an outside provider. Patient/Guardian was advised if they have not already done so to contact the registration department to sign all necessary forms in order for [place] to release information regarding their care.   Consent: Patient/Guardian gives verbal consent for treatment and assignment of benefits for services provided during this visit. Patient/Guardian expressed understanding and agreed to proceed.   Neysa Hotter, MD 6/3/20238:42 AM

## 2022-05-30 ENCOUNTER — Encounter (HOSPITAL_COMMUNITY): Payer: Self-pay | Admitting: Psychiatry

## 2022-05-30 ENCOUNTER — Telehealth (HOSPITAL_BASED_OUTPATIENT_CLINIC_OR_DEPARTMENT_OTHER): Payer: Self-pay | Admitting: Psychiatry

## 2022-05-30 DIAGNOSIS — F322 Major depressive disorder, single episode, severe without psychotic features: Secondary | ICD-10-CM

## 2022-05-30 MED ORDER — HYDROXYZINE PAMOATE 25 MG PO CAPS: 25.0000 mg | ORAL_CAPSULE | Freq: Every day | ORAL | 0 refills | Status: DC | PRN

## 2022-05-30 MED ORDER — ESCITALOPRAM OXALATE 10 MG PO TABS: 10.0000 mg | ORAL_TABLET | Freq: Every day | ORAL | 0 refills | Status: DC

## 2022-05-30 NOTE — Patient Instructions (Signed)
Start lexapro 10 mg daily  Continue hydroxyzine 25 mg at night as needed for insomnia/anxiety Referral to therapy Next appointment- 6/29 at 4 PM, video

## 2022-06-02 ENCOUNTER — Encounter: Payer: Self-pay | Admitting: Family Medicine

## 2022-06-02 NOTE — Telephone Encounter (Signed)
Note sent via Mychart per patient request.

## 2022-06-04 ENCOUNTER — Ambulatory Visit (INDEPENDENT_AMBULATORY_CARE_PROVIDER_SITE_OTHER): Payer: BC Managed Care – PPO

## 2022-06-04 DIAGNOSIS — J309 Allergic rhinitis, unspecified: Secondary | ICD-10-CM | POA: Diagnosis not present

## 2022-06-09 NOTE — Telephone Encounter (Signed)
Did a video visit with hiseda on June 3. Will you write this accomidation or should she go through [facility]?

## 2022-06-10 ENCOUNTER — Encounter: Payer: Self-pay | Admitting: Family Medicine

## 2022-06-11 ENCOUNTER — Encounter: Payer: Self-pay | Admitting: Family Medicine

## 2022-06-11 ENCOUNTER — Ambulatory Visit (INDEPENDENT_AMBULATORY_CARE_PROVIDER_SITE_OTHER): Payer: BC Managed Care – PPO

## 2022-06-11 ENCOUNTER — Telehealth: Payer: Self-pay | Admitting: Family Medicine

## 2022-06-11 DIAGNOSIS — J309 Allergic rhinitis, unspecified: Secondary | ICD-10-CM | POA: Diagnosis not present

## 2022-06-11 NOTE — Telephone Encounter (Signed)
This has been sent to her mychart

## 2022-06-11 NOTE — Telephone Encounter (Signed)
Patient called in regard to work note. Patient is requesting it be sent to mychart so that she can send in to Supervisor.

## 2022-06-18 ENCOUNTER — Ambulatory Visit (INDEPENDENT_AMBULATORY_CARE_PROVIDER_SITE_OTHER): Payer: BC Managed Care – PPO

## 2022-06-18 DIAGNOSIS — J309 Allergic rhinitis, unspecified: Secondary | ICD-10-CM | POA: Diagnosis not present

## 2022-06-22 NOTE — Progress Notes (Signed)
Virtual Visit via Video Note  I connected with Kelly [name] on 06/25/22 at  4:00 PM EDT by a video enabled telemedicine application and verified that I am speaking with the correct person using two identifiers.  Location: Patient: home Provider: office Persons participated in the visit- patient, provider    I discussed the limitations of evaluation and management by telemedicine and the availability of in person appointments. The patient expressed understanding and agreed to proceed.    I discussed the assessment and treatment plan with the patient. The patient was provided an opportunity to ask questions and all were answered. The patient agreed with the plan and demonstrated an understanding of the instructions.   The patient was advised to call back or seek an in-person evaluation if the symptoms worsen or if the condition fails to improve as anticipated.  I provided 15 minutes of non-face-to-face time during this encounter.   Neysa Hotter, MD    [facility] MD/PA/NP OP Progress Note  06/25/2022 4:31 PM Kelly [name]  MRN:  [id number]  Chief Complaint:  Chief Complaint  Patient presents with   Follow-up   Depression   Anxiety   HPI:  This is a follow-up appointment for depression.  She states that she has been feeling the same.  She is out of work for the next few months.  She continues to have a good relationship with others including her boyfriend and her mother. Although she used to enjoy going outside, she tends to stay in the house, feeling drained.  Although she is trying to find another job, it has been difficult due to low energy.  She states 4 to 6 hours.  She finds hydroxyzine to be helpful for sleep.  She has decrease in appetite; may eat 1 meal per day.  She denies SI.  Although she feels anxious at times, she denies panic attacks.  She has nausea, and headaches since starting Lexapro; it has not subsided.  She is willing to try sertraline at this time.   Wt  Readings from Last 3 Encounters:  06/24/22 114 lb 12.8 oz (52.1 kg)  05/21/22 116 lb 1.9 oz (52.7 kg)  11/12/21 120 lb (54.4 kg)      Support:mother, sister, boyfriend Household: by herself Marital status:single Number of children: 0  Employment: HR of six years Education:   Last PCP / ongoing medical evaluation:   She describes her childhood as good.  She reports good relationship with her parents and her sister.   Visit Diagnosis:    ICD-10-CM   1. Current severe episode of major depressive disorder without psychotic features without prior episode (HCC)  F32.2     2. Anxiety state  F41.1       Past Psychiatric History: Please see initial evaluation for full details. I have reviewed the history. No updates at this time.     Past Medical History:  Past Medical History:  Diagnosis Date   Allergy    penicillin   Depression, major, single episode, severe (HCC) 05/21/2022   Situational anxiety 05/21/2021   Vertigo     Past Surgical History:  Procedure Laterality Date   ADENOIDECTOMY     TONSILLECTOMY     under 12     Family Psychiatric History: Please see initial evaluation for full details. I have reviewed the history. No updates at this time.     Family History:  Family History  Problem Relation Age of Onset   Hypertension Mother    Anxiety  disorder Father    Hypertension Father    Hypertension Sister    Hypertension Maternal Grandfather    Hypertension Maternal Grandmother    Hypertension Paternal Grandfather    Hypertension Paternal Grandmother     Social History:  Social History   Socioeconomic History   Marital status: Single    Spouse name: Not on file   Number of children: 0   Years of education: 16   Highest education level: Not on file  Occupational History   Occupation: HR Interior and spatial designer    Comment: [employer]  Tobacco Use   Smoking status: Never   Smokeless tobacco: Never  Vaping Use   Vaping Use: Never used  Substance and Sexual Activity    Alcohol use: No   Drug use: No   Sexual activity: Yes    Birth control/protection: Condom  Other Topics Concern   Not on file  Social History Narrative   Graduated   Occupational hygienist for [employer]   Lives in [place] - lives alone   Social Determinants of Health   Financial Resource Strain: Low Risk  (04/15/2021)   Overall Financial Resource Strain (CARDIA)    Difficulty of Paying Living Expenses: Not hard at all  Food Insecurity: No Food Insecurity (04/15/2021)   Hunger Vital Sign    Worried About Running Out of Food in the Last Year: Never true    Ran Out of Food in the Last Year: Never true  Transportation Needs: No Transportation Needs (04/15/2021)   PRAPARE - Administrator, Civil Service (Medical): No    Lack of Transportation (Non-Medical): No  Physical Activity: Insufficiently Active (04/15/2021)   Exercise Vital Sign    Days of Exercise per Week: 2 days    Minutes of Exercise per Session: 60 min  Stress: No Stress Concern Present (04/15/2021)   [employer] of Occupational Health - Occupational Stress Questionnaire    Feeling of Stress : Only a little  Social Connections: Moderately Isolated (04/15/2021)   Social Connection and Isolation Panel [NHANES]    Frequency of Communication with Friends and Family: More than three times a week    Frequency of Social Gatherings with Friends and Family: More than three times a week    Attends Religious Services: More than 4 times per year    Active Member of [employer] or Organizations: No    Attends Banker Meetings: Patient refused    Marital Status: Never married    Allergies:  Allergies  Allergen Reactions   Penicillins Rash    Metabolic Disorder Labs: No results found for: "HGBA1C", "MPG" No results found for: "PROLACTIN" Lab Results  Component Value Date   CHOL 194 04/01/2022   TRIG 40 04/01/2022   HDL 74 04/01/2022   CHOLHDL 2.6 04/01/2022   LDLCALC 112 (H) 04/01/2022   LDLCALC 111  (H) 11/05/2021   Lab Results  Component Value Date   TSH 1.240 04/01/2022   TSH 2.000 05/23/2021    Therapeutic Level Labs: No results found for: "LITHIUM" No results found for: "VALPROATE" No results found for: "CBMZ"  Current Medications: Current Outpatient Medications  Medication Sig Dispense Refill   sertraline (ZOLOFT) 50 MG tablet 25 mg at night for one week, then 50 mg at night 30 tablet 1   clobetasol (TEMOVATE) 0.05 % external solution Apply topically.     doxycycline (VIBRA-TABS) 100 MG tablet Take 1 tablet (100 mg total) by mouth daily. 30 tablet 11   EPINEPHrine 0.3  mg/0.3 mL IJ SOAJ injection Inject 0.3 mg into the muscle as needed for anaphylaxis. 1 each 2   fluticasone (FLONASE) 50 MCG/ACT nasal spray Use 2 sprays in each nostril BID for a week. After 1 week, decrease to 1 spray in each nostril BID as needed for congestion/allergies. 16 g 6   hydrOXYzine (VISTARIL) 25 MG capsule Take 1 capsule (25 mg total) by mouth daily as needed for anxiety. 30 capsule 1   montelukast (SINGULAIR) 10 MG tablet TAKE 1 TABLET BY MOUTH EVERYDAY AT BEDTIME 90 tablet 0   Olopatadine HCl 0.2 % SOLN Apply 1 drop to eye daily as needed. 2.5 mL 5   spironolactone (ALDACTONE) 50 MG tablet SMARTSIG:1 Pill By Mouth Every Morning     tretinoin (RETIN-A) 0.025 % cream SMARTSIG:Topical Every Evening     No current facility-administered medications for this visit.     Musculoskeletal: Strength & Muscle Tone:  N/A Gait & Station:  N/A Patient leans: N/A  Psychiatric Specialty Exam: Review of Systems  Psychiatric/Behavioral:  Positive for decreased concentration, dysphoric mood and sleep disturbance. Negative for agitation, behavioral problems, confusion, hallucinations, self-injury and suicidal ideas. The patient is nervous/anxious and is hyperactive.   All other systems reviewed and are negative.   There were no vitals taken for this visit.There is no height or weight on file to calculate  BMI.  General Appearance: Fairly Groomed  Eye Contact:  Good  Speech:  Clear and Coherent  Volume:  Normal  Mood:  Depressed  Affect:  Appropriate, Congruent, and down  Thought Process:  Coherent  Orientation:  Full (Time, Place, and Person)  Thought Content: Logical   Suicidal Thoughts:  No  Homicidal Thoughts:  No  Memory:  Immediate;   Good  Judgement:  Good  Insight:  Good  Psychomotor Activity:  Normal  Concentration:  Concentration: Good and Attention Span: Good  Recall:  Good  Fund of Knowledge: Good  Language: Good  Akathisia:  No  Handed:  Right  AIMS (if indicated): not done  Assets:  Communication Skills Desire for Improvement  ADL's:  Intact  Cognition: WNL  Sleep:  Poor   Screenings: GAD-7    Flowsheet Row Office Visit from 06/24/2022 in [facility] Office Visit from 05/21/2022 in [facility] Office Visit from 05/27/2021 in [facility] Office Visit from 05/21/2021 in [facility] Office Visit from 04/15/2021 in [facility]  Total GAD-7 Score 18 19 2 4 4       PHQ2-9    Flowsheet Row Office Visit from 06/24/2022 in [facility] Video Visit from 05/30/2022 in [facility] Office Visit from 05/21/2022 in [facility] Office Visit from 11/12/2021 in [facility] Office Visit from 08/05/2021 in [facility]  PHQ-2 Total Score 6 6 6  0 0  PHQ-9 Total Score 23 22 21  0 --      Flowsheet Row ED from 05/23/2021 in [facility] EMERGENCY DEPARTMENT ED from 05/22/2021 in [facility] EM[facility] DEPARTMENT  C-SSRS RISK CATEGORY No Risk No Risk        Assessment and Plan:  Kelly [name] is a 31 y.o. year old female with a history of  depression, anxiety, who presents for follow up appointment for below.   1. Current severe episode of major depressive disorder without psychotic features without prior episode (HCC) 2. Anxiety  state She continues to report depressive symptoms since the last visit.  Psychosocial stressors  includes issues at work/filing a case for discrimination. She reports nausea, and headaches since starting Lexapro, she has not been improving.  Will switch from Lexapro to sertraline to target depression.  Discussed potential risk of drowsiness and GI side effect.  Will continue hydroxyzine as needed for anxiety.      Plan Change medication as follows:  Week 1: decrease lexapro 5 mg daily, start sertraline 25 mg at night  Week 2: Discontinue Lexapro, increase sertraline 50 mg at night Week 3: continue sertraline 50 mg at night  Continue hydroxyzine 25 mg daily as needed for anxiety  Referred to therapy Next appointment- 8/16 at 3 PM, video  Past trials of medication: escitalopram, propranolol (chest pain)  The patient demonstrates the following risk factors for suicide: Chronic risk factors for suicide include: psychiatric disorder of depression . Acute risk factors for suicide include: loss (financial, interpersonal, professional). Protective factors for this patient include: positive social support, coping skills, and hope for the future. Considering these factors, the overall suicide risk at this point appears to be low. Patient is appropriate for outpatient follow up.    Collaboration of Care: Collaboration of Care: Other N/A  Patient/Guardian was advised Release of Information must be obtained prior to any record release in order to collaborate their care with an outside provider. Patient/Guardian was advised if they have not already done so to contact the registration department to sign all necessary forms in order for [place] to release information regarding their care.   Consent: Patient/Guardian gives verbal consent for treatment and assignment of benefits for services provided during this visit. Patient/Guardian expressed understanding and agreed to proceed.    Neysa Hotter, MD 06/25/2022,  4:31 PM

## 2022-06-24 ENCOUNTER — Ambulatory Visit: Payer: BC Managed Care – PPO | Admitting: Family Medicine

## 2022-06-24 ENCOUNTER — Encounter: Payer: Self-pay | Admitting: Family Medicine

## 2022-06-24 VITALS — BP 134/80 | HR 85 | Resp 16 | Ht 64.0 in | Wt 114.8 lb

## 2022-06-24 DIAGNOSIS — F419 Anxiety disorder, unspecified: Secondary | ICD-10-CM

## 2022-06-24 DIAGNOSIS — F322 Major depressive disorder, single episode, severe without psychotic features: Secondary | ICD-10-CM | POA: Diagnosis not present

## 2022-06-24 NOTE — Assessment & Plan Note (Signed)
Score of 18 in 06/24/2022, out of workuntil re evall in October. Definitive reatment through Psychiatry and therapy

## 2022-06-24 NOTE — Assessment & Plan Note (Signed)
Score of 23, not suicidal or homicidal, has definitive management through psychiatry and therapy Unable to work currently, taken out until mid October, f/u 1 week prior
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
## 2022-06-24 NOTE — Patient Instructions (Addendum)
F/u in week of October 9, call if you need me before  Please take care of your health, first and foremost  Psychiatry will prescribe mental health meds, and therapy is equally important  Work excuse from today to 10/14/2022.  Thanks for choosing [facility], we consider it a privelige to serve you.

## 2022-06-25 ENCOUNTER — Encounter: Payer: Self-pay | Admitting: Psychiatry

## 2022-06-25 ENCOUNTER — Telehealth (INDEPENDENT_AMBULATORY_CARE_PROVIDER_SITE_OTHER): Payer: Self-pay | Admitting: Psychiatry

## 2022-06-25 ENCOUNTER — Encounter: Payer: Self-pay | Admitting: Family Medicine

## 2022-06-25 ENCOUNTER — Telehealth: Payer: Self-pay

## 2022-06-25 ENCOUNTER — Ambulatory Visit (INDEPENDENT_AMBULATORY_CARE_PROVIDER_SITE_OTHER): Payer: BC Managed Care – PPO

## 2022-06-25 DIAGNOSIS — F411 Generalized anxiety disorder: Secondary | ICD-10-CM

## 2022-06-25 DIAGNOSIS — J309 Allergic rhinitis, unspecified: Secondary | ICD-10-CM | POA: Diagnosis not present

## 2022-06-25 DIAGNOSIS — F322 Major depressive disorder, single episode, severe without psychotic features: Secondary | ICD-10-CM

## 2022-06-25 DIAGNOSIS — Z0279 Encounter for issue of other medical certificate: Secondary | ICD-10-CM

## 2022-06-25 MED ORDER — SERTRALINE HCL 50 MG PO TABS: ORAL_TABLET | ORAL | 1 refills | Status: DC

## 2022-06-25 MED ORDER — HYDROXYZINE PAMOATE 25 MG PO CAPS: 25.0000 mg | ORAL_CAPSULE | Freq: Every day | ORAL | 1 refills | Status: AC | PRN

## 2022-06-25 NOTE — Patient Instructions (Signed)
Change medication as follows:  Week 1: decrease lexapro 5 mg daily, start sertraline 25 mg at night  Week 2: Discontinue Lexapro, increase sertraline 50 mg at night Week 3: continue sertraline 50 mg at night  Continue hydroxyzine 25 mg daily as needed for anxiety  Referred to therapy Next appointment- 8/16 at 3 PM

## 2022-06-25 NOTE — Telephone Encounter (Signed)
[employer] short term disability forms Copied Noted Sleeved  Call patient when ready will need to complete her part.

## 2022-06-26 ENCOUNTER — Telehealth: Payer: Self-pay | Admitting: Family Medicine

## 2022-06-26 NOTE — Telephone Encounter (Signed)
She states it was 12/09/21 and forms sent for charges and to be faxed

## 2022-06-26 NOTE — Telephone Encounter (Signed)
Pls contact pt ascertain date in 11/2021 when symptoms first started and fill in space on pg 4 Pls advise next appt with me in 09/2022 as psych is manageing her care, so ancel the July one , unless she specifically asks to keep it, will need to change the date I entered on pg 4 for her f/u if that is the case also Paperwork ready to go after that, ensure we have copies for reference, thanks

## 2022-06-26 NOTE — Telephone Encounter (Signed)
Called patient left voicemail forms are ready. Forms were fax to [facility] fax  # [phone]. Copied at front desk if needs to be picked.

## 2022-07-02 ENCOUNTER — Ambulatory Visit (INDEPENDENT_AMBULATORY_CARE_PROVIDER_SITE_OTHER): Payer: BC Managed Care – PPO

## 2022-07-02 DIAGNOSIS — J309 Allergic rhinitis, unspecified: Secondary | ICD-10-CM | POA: Diagnosis not present

## 2022-07-09 ENCOUNTER — Ambulatory Visit (INDEPENDENT_AMBULATORY_CARE_PROVIDER_SITE_OTHER): Payer: BC Managed Care – PPO

## 2022-07-09 DIAGNOSIS — J309 Allergic rhinitis, unspecified: Secondary | ICD-10-CM | POA: Diagnosis not present

## 2022-07-10 ENCOUNTER — Ambulatory Visit (INDEPENDENT_AMBULATORY_CARE_PROVIDER_SITE_OTHER): Payer: BC Managed Care – PPO | Admitting: Psychologist

## 2022-07-10 DIAGNOSIS — F321 Major depressive disorder, single episode, moderate: Secondary | ICD-10-CM

## 2022-07-10 DIAGNOSIS — F411 Generalized anxiety disorder: Secondary | ICD-10-CM

## 2022-07-10 NOTE — Plan of Care (Signed)

## 2022-07-10 NOTE — Progress Notes (Signed)
                Vihan Santagata, PsyD 

## 2022-07-10 NOTE — Progress Notes (Signed)
[facility] Counselor Initial Adult Exam  Name: Kelly [name] Date: 07/10/2022 MRN: [id number] DOB: 1991-04-27 PCP: Kerri Perches, MD  Time spent: 1:01 pm to 1:30 pm; total time: 29 minutes  This session was held via video webex teletherapy due to the coronavirus risk at this time. The patient consented to video teletherapy and was located at her home during this session. She is aware it is the responsibility of the patient to secure confidentiality on her end of the session. The provider was in a private home office for the duration of this session. Limits of confidentiality were discussed with the patient.   Guardian/Payee:  NA    Paperwork requested: No   Reason for Visit /Presenting Problem: Depression and anxiety  Mental Status Exam: Appearance:   Well Groomed     Behavior:  Appropriate  Motor:  Normal  Speech/Language:   Clear and Coherent  Affect:  Appropriate  Mood:  normal  Thought process:  normal  Thought content:    WNL  Sensory/Perceptual disturbances:    WNL  Orientation:  oriented to person, place, and time/date  Attention:  Good  Concentration:  Good  Memory:  WNL  Fund of knowledge:   Good  Insight:    Good  Judgment:   Good  Impulse Control:  Good    Reported Symptoms:  The patient endorsed experiencing the following: racing thoughts, feeling on edge, feeling restless, feeling overwhelmed, and difficulty controlling worries. She denied suicidal and homicidal ideation.   The patient endorsed experiencing the following: feeling down, sad, tearful, rumination of negative thoughts, low self-esteem, thoughts of worthlessness, social isolation, avoiding pleasurable activities, and thoughts of hopelessness. She denied suicidal and homicidal ideation.   Risk Assessment: Danger to Self:   NO Self-injurious Behavior: No Danger to Others: No Duty to Warn:no Physical Aggression / Violence:No  Access to Firearms a concern: No  Gang  Involvement:No  Patient / guardian was educated about steps to take if suicide or homicide risk level increases between visits: n/a While future psychiatric events cannot be accurately predicted, the patient does not currently require acute inpatient psychiatric care and does not currently meet [facility] involuntary commitment criteria.  Substance Abuse History: Current substance abuse: No     Past Psychiatric History:   No previous psychological problems have been observed Outpatient Providers:NA History of Psych Hospitalization: No  Psychological Testing:  NA    Abuse History:  Victim of: No.,  NA    Report needed: No. Victim of Neglect:No. Perpetrator of  NA   Witness / Exposure to Domestic Violence: No   Protective Services Involvement: No  Witness to [employer] Violence:  No   Family History:  Family History  Problem Relation Age of Onset   Hypertension Mother    Anxiety disorder Father    Hypertension Father    Hypertension Sister    Hypertension Maternal Grandfather    Hypertension Maternal Grandmother    Hypertension Paternal Grandfather    Hypertension Paternal Grandmother     Living situation: the patient lives alone  Sexual Orientation: Straight  Relationship Status: Dating  Name of spouse / other:CJ If a parent, number of children / ages:NA  Support Systems: Family and Scientist, forensic Stress:  No   Income/Employment/Disability: Employment  Financial planner: No   Educational History: Education: post Engineer, maintenance (IT) work or degree  Religion/Sprituality/World View: Christian  Any cultural differences that may affect / interfere with treatment:  not applicable   Recreation/Hobbies: Exercising  and being physically active  Stressors: Other: Work stress    Strengths: Supportive Relationships  Barriers:  Working in an environment without support from Materials engineer History: Pending legal issue / charges: The patient has no significant  history of legal issues. History of legal issue / charges:  NA  Medical History/Surgical History: reviewed Past Medical History:  Diagnosis Date   Allergy    penicillin   Depression, major, single episode, severe (HCC) 05/21/2022   Situational anxiety 05/21/2021   Vertigo     Past Surgical History:  Procedure Laterality Date   ADENOIDECTOMY     TONSILLECTOMY     under 12     Medications: Current Outpatient Medications  Medication Sig Dispense Refill   clobetasol (TEMOVATE) 0.05 % external solution Apply topically.     doxycycline (VIBRA-TABS) 100 MG tablet Take 1 tablet (100 mg total) by mouth daily. 30 tablet 11   EPINEPHrine 0.3 mg/0.3 mL IJ SOAJ injection Inject 0.3 mg into the muscle as needed for anaphylaxis. 1 each 2   fluticasone (FLONASE) 50 MCG/ACT nasal spray Use 2 sprays in each nostril BID for a week. After 1 week, decrease to 1 spray in each nostril BID as needed for congestion/allergies. 16 g 6   hydrOXYzine (VISTARIL) 25 MG capsule Take 1 capsule (25 mg total) by mouth daily as needed for anxiety. 30 capsule 1   montelukast (SINGULAIR) 10 MG tablet TAKE 1 TABLET BY MOUTH EVERYDAY AT BEDTIME 90 tablet 0   Olopatadine HCl 0.2 % SOLN Apply 1 drop to eye daily as needed. 2.5 mL 5   sertraline (ZOLOFT) 50 MG tablet 25 mg at night for one week, then 50 mg at night 30 tablet 1   spironolactone (ALDACTONE) 50 MG tablet SMARTSIG:1 Pill By Mouth Every Morning     tretinoin (RETIN-A) 0.025 % cream SMARTSIG:Topical Every Evening     No current facility-administered medications for this visit.    Allergies  Allergen Reactions   Penicillins Rash    Diagnoses:  F41.1 generalized anxiety disorder and F32.1 major depressive affective disorder, single episode, moderate  Plan of Care: The patient is a 31 year old Black woman who was referred for counseling due to experiencing anxiety and depression. The patient lives alone. The patient meets criteria for a diagnosis of F41.1  generalized anxiety disorder based off of the following: racing thoughts, feeling on edge, feeling restless, feeling overwhelmed, and difficulty controlling worries. She denied suicidal and homicidal ideation. The patient also meets criteria for a diagnosis of F32.1 major depressive affective disorder, single episode, moderate based off of the following: feeling down, sad, tearful, rumination of negative thoughts, low self-esteem, thoughts of worthlessness, social isolation, avoiding pleasurable activities, and thoughts of hopelessness. She denied suicidal and homicidal ideation.   The patient stated that she wants coping skills, to process thoughts and emotions, and to work on improving self-esteem.   This psychologist makes the recommendation that the patient participate in therapy at least monthly. If possible bi-weekly.    Hilbert Corrigan, PsyD

## 2022-07-14 ENCOUNTER — Telehealth: Payer: Self-pay

## 2022-07-14 ENCOUNTER — Ambulatory Visit (INDEPENDENT_AMBULATORY_CARE_PROVIDER_SITE_OTHER): Payer: BC Managed Care – PPO

## 2022-07-14 DIAGNOSIS — J309 Allergic rhinitis, unspecified: Secondary | ICD-10-CM | POA: Diagnosis not present

## 2022-07-14 NOTE — Telephone Encounter (Signed)
Disability forms (needs appt)  Copied Noted Sleeved

## 2022-07-14 NOTE — Progress Notes (Signed)
VIALS EXP 07-15-23 

## 2022-07-14 NOTE — Telephone Encounter (Signed)
Patient will contact [employer] to see if forms are needed.

## 2022-07-15 DIAGNOSIS — J302 Other seasonal allergic rhinitis: Secondary | ICD-10-CM | POA: Diagnosis not present

## 2022-07-16 ENCOUNTER — Ambulatory Visit: Payer: BC Managed Care – PPO | Admitting: Family Medicine

## 2022-07-21 ENCOUNTER — Ambulatory Visit (INDEPENDENT_AMBULATORY_CARE_PROVIDER_SITE_OTHER): Payer: BC Managed Care – PPO

## 2022-07-21 DIAGNOSIS — J309 Allergic rhinitis, unspecified: Secondary | ICD-10-CM | POA: Diagnosis not present

## 2022-07-22 ENCOUNTER — Other Ambulatory Visit: Payer: Self-pay | Admitting: Psychiatry

## 2022-07-29 ENCOUNTER — Other Ambulatory Visit: Payer: Self-pay | Admitting: Allergy

## 2022-07-29 ENCOUNTER — Other Ambulatory Visit: Payer: Self-pay

## 2022-07-30 ENCOUNTER — Ambulatory Visit (INDEPENDENT_AMBULATORY_CARE_PROVIDER_SITE_OTHER): Payer: 59

## 2022-07-30 DIAGNOSIS — J309 Allergic rhinitis, unspecified: Secondary | ICD-10-CM | POA: Diagnosis not present

## 2022-07-31 ENCOUNTER — Ambulatory Visit: Payer: BC Managed Care – PPO | Admitting: Psychologist

## 2022-08-06 ENCOUNTER — Ambulatory Visit (INDEPENDENT_AMBULATORY_CARE_PROVIDER_SITE_OTHER): Payer: 59 | Admitting: *Deleted

## 2022-08-06 DIAGNOSIS — J309 Allergic rhinitis, unspecified: Secondary | ICD-10-CM

## 2022-08-12 ENCOUNTER — Telehealth: Payer: Self-pay | Admitting: Psychiatry

## 2022-08-19 ENCOUNTER — Other Ambulatory Visit: Payer: Self-pay | Admitting: Family Medicine

## 2022-08-19 ENCOUNTER — Other Ambulatory Visit (HOSPITAL_COMMUNITY): Payer: Self-pay

## 2022-08-19 MED ORDER — TRETINOIN 0.025 % EX CREA
TOPICAL_CREAM | Freq: Every day | CUTANEOUS | 0 refills | Status: AC
  Filled 2022-08-19: qty 45, 30d supply, fill #0

## 2022-08-20 ENCOUNTER — Ambulatory Visit (INDEPENDENT_AMBULATORY_CARE_PROVIDER_SITE_OTHER): Payer: 59

## 2022-08-20 DIAGNOSIS — J309 Allergic rhinitis, unspecified: Secondary | ICD-10-CM

## 2022-08-21 ENCOUNTER — Other Ambulatory Visit (HOSPITAL_COMMUNITY): Payer: Self-pay

## 2022-08-30 ENCOUNTER — Other Ambulatory Visit: Payer: Self-pay | Admitting: Psychiatry

## 2022-09-01 ENCOUNTER — Ambulatory Visit (INDEPENDENT_AMBULATORY_CARE_PROVIDER_SITE_OTHER): Payer: 59

## 2022-09-01 DIAGNOSIS — J309 Allergic rhinitis, unspecified: Secondary | ICD-10-CM | POA: Diagnosis not present

## 2022-09-03 ENCOUNTER — Other Ambulatory Visit: Payer: Self-pay | Admitting: Psychiatry

## 2022-09-10 ENCOUNTER — Ambulatory Visit (INDEPENDENT_AMBULATORY_CARE_PROVIDER_SITE_OTHER): Payer: 59

## 2022-09-10 DIAGNOSIS — J309 Allergic rhinitis, unspecified: Secondary | ICD-10-CM

## 2022-09-17 ENCOUNTER — Ambulatory Visit: Payer: 59

## 2022-09-22 ENCOUNTER — Ambulatory Visit (INDEPENDENT_AMBULATORY_CARE_PROVIDER_SITE_OTHER): Payer: 59

## 2022-09-22 DIAGNOSIS — J309 Allergic rhinitis, unspecified: Secondary | ICD-10-CM | POA: Diagnosis not present

## 2022-10-06 ENCOUNTER — Ambulatory Visit: Payer: BC Managed Care – PPO | Admitting: Family Medicine

## 2022-10-08 ENCOUNTER — Ambulatory Visit (INDEPENDENT_AMBULATORY_CARE_PROVIDER_SITE_OTHER): Payer: 59

## 2022-10-08 DIAGNOSIS — J309 Allergic rhinitis, unspecified: Secondary | ICD-10-CM

## 2022-10-15 ENCOUNTER — Encounter: Payer: Self-pay | Admitting: Family Medicine

## 2022-10-16 ENCOUNTER — Other Ambulatory Visit: Payer: Self-pay

## 2022-10-16 DIAGNOSIS — L700 Acne vulgaris: Secondary | ICD-10-CM

## 2022-10-16 DIAGNOSIS — E785 Hyperlipidemia, unspecified: Secondary | ICD-10-CM

## 2022-10-16 DIAGNOSIS — E559 Vitamin D deficiency, unspecified: Secondary | ICD-10-CM

## 2022-10-20 ENCOUNTER — Ambulatory Visit (INDEPENDENT_AMBULATORY_CARE_PROVIDER_SITE_OTHER): Payer: 59

## 2022-10-20 DIAGNOSIS — J309 Allergic rhinitis, unspecified: Secondary | ICD-10-CM | POA: Diagnosis not present

## 2022-10-24 LAB — LIPID PANEL
Chol/HDL Ratio: 2.6 ratio (ref 0.0–4.4)
Cholesterol, Total: 192 mg/dL (ref 100–199)
HDL: 73 mg/dL (ref >39)
LDL Chol Calc (NIH): 108 mg/dL — ABNORMAL HIGH (ref 0–99)
Triglycerides: 60 mg/dL (ref 0–149)
VLDL Cholesterol Cal: 11 mg/dL (ref 5–40)

## 2022-10-24 LAB — CMP14+EGFR
ALT: 7 IU/L (ref 0–32)
AST: 17 IU/L (ref 0–40)
Albumin/Globulin Ratio: 1.6 (ref 1.2–2.2)
Albumin: 4.6 g/dL (ref 4.0–5.0)
Alkaline Phosphatase: 36 IU/L — ABNORMAL LOW (ref 44–121)
BUN/Creatinine Ratio: 12 (ref 9–23)
BUN: 9 mg/dL (ref 6–20)
Bilirubin Total: 0.6 mg/dL (ref 0.0–1.2)
CO2: 24 mmol/L (ref 20–29)
Calcium: 9.5 mg/dL (ref 8.7–10.2)
Chloride: 101 mmol/L (ref 96–106)
Creatinine, Ser: 0.74 mg/dL (ref 0.57–1.00)
Globulin, Total: 2.8 g/dL (ref 1.5–4.5)
Glucose: 95 mg/dL (ref 70–99)
Potassium: 3.6 mmol/L (ref 3.5–5.2)
Sodium: 141 mmol/L (ref 134–144)
Total Protein: 7.4 g/dL (ref 6.0–8.5)
eGFR: 112 mL/min/{1.73_m2} (ref >59)

## 2022-10-24 LAB — VITAMIN D 25 HYDROXY (VIT D DEFICIENCY, FRACTURES): Vit D, 25-Hydroxy: 25.8 ng/mL — ABNORMAL LOW (ref 30.0–100.0)

## 2022-10-26 DIAGNOSIS — J302 Other seasonal allergic rhinitis: Secondary | ICD-10-CM | POA: Diagnosis not present

## 2022-10-26 NOTE — Progress Notes (Signed)
VIALS EXP 10-27-23

## 2022-11-03 ENCOUNTER — Ambulatory Visit (INDEPENDENT_AMBULATORY_CARE_PROVIDER_SITE_OTHER): Payer: 59

## 2022-11-03 DIAGNOSIS — J309 Allergic rhinitis, unspecified: Secondary | ICD-10-CM

## 2022-11-17 ENCOUNTER — Ambulatory Visit (INDEPENDENT_AMBULATORY_CARE_PROVIDER_SITE_OTHER): Payer: 59

## 2022-11-17 DIAGNOSIS — J309 Allergic rhinitis, unspecified: Secondary | ICD-10-CM | POA: Diagnosis not present

## 2022-11-24 ENCOUNTER — Ambulatory Visit: Payer: 59 | Admitting: Family Medicine

## 2022-11-24 ENCOUNTER — Encounter: Payer: Self-pay | Admitting: Family Medicine

## 2022-11-24 DIAGNOSIS — F418 Other specified anxiety disorders: Secondary | ICD-10-CM | POA: Diagnosis not present

## 2022-11-24 DIAGNOSIS — E785 Hyperlipidemia, unspecified: Secondary | ICD-10-CM

## 2022-11-24 DIAGNOSIS — L658 Other specified nonscarring hair loss: Secondary | ICD-10-CM

## 2022-11-24 DIAGNOSIS — J3089 Other allergic rhinitis: Secondary | ICD-10-CM

## 2022-11-24 DIAGNOSIS — J302 Other seasonal allergic rhinitis: Secondary | ICD-10-CM | POA: Diagnosis not present

## 2022-11-24 DIAGNOSIS — R7989 Other specified abnormal findings of blood chemistry: Secondary | ICD-10-CM

## 2022-11-24 DIAGNOSIS — H1010 Acute atopic conjunctivitis, unspecified eye: Secondary | ICD-10-CM

## 2022-11-24 NOTE — Assessment & Plan Note (Signed)
Had worsened with stress, change in therapy to oral minoxidil, and spironolactone continues, Dermatology treating

## 2022-11-24 NOTE — Patient Instructions (Addendum)
F/U in 6  months, call if you need me sooner   Please get flu vaccine  Keep open mind  to covid vaccine  Fasting CBC, lipid, cmp and eGFr, TSH and vit d 1 week before f/u  Thankful that you are much improved, continue good health habits  Best  for the Season and 2024!

## 2022-11-24 NOTE — Assessment & Plan Note (Addendum)
On immunotherapy with good control, managed by Allergist

## 2022-11-24 NOTE — Assessment & Plan Note (Signed)
Continue daily supplement Updated lab needed at/ before next visit.  

## 2022-11-24 NOTE — Progress Notes (Signed)
Virtual Visit via Telephone Note  I connected with [name] on 11/24/22 at  4:20 PM EST by telephone and verified that I am speaking with the correct person using two identifiers.  Location: Patient: home Provider: office    I discussed the limitations, risks, security and privacy concerns of performing an evaluation and management service by telephone and the availability of in person appointments. I also discussed with the patient that there may be a patient responsible charge related to this service. The patient expressed understanding and agreed to proceed.   History of Present Illness: F/u chronic problems , lab review and update routine health Much improved mentally, no longer depressed or having GAD issues, was able to start a new job in the Summer and had settlement of case with workplace harrassment. Has had no need for ongoing mental health medication since then Has resumed exercise Doing well with immunotherapy and med management of her allergies. Alopecia treatment has changed form injections to oral minoxidil. Intermittent fatigue which she attributes to low vit D   Observations/Objective: BP 101/70   Ht 5\' 3"  (1.6 m)   Wt 116 lb (52.6 kg)   LMP 11/06/2022 (Approximate)   BMI 20.55 kg/m  Good communication with no confusion and intact memory. Alert and oriented x 3 No signs of respiratory distress during speech   Assessment and Plan:  Seasonal and perennial allergic rhinoconjunctivitis On immunotherapy with good control, managed by Allergist  Traction alopecia Had worsened with stress, change in therapy to oral minoxidil, and spironolactone continues, Dermatology treating  Situational anxiety Resolved with  change in  employment  Hyperlipidemia LDL goal <100 Hyperlipidemia:Low fat diet discussed and encouraged.   Lipid Panel  Lab Results  Component Value Date   CHOL 192 10/23/2022   HDL 73 10/23/2022   LDLCALC 108 (H) 10/23/2022   TRIG 60  10/23/2022   CHOLHDL 2.6 10/23/2022     Neds to reduce fat intake   Low vitamin D level Continue daily supplement  Updated lab needed at/ before next visit.   Follow Up Instructions:    I discussed the assessment and treatment plan with the patient. The patient was provided an opportunity to ask questions and all were answered. The patient agreed with the plan and demonstrated an understanding of the instructions.   The patient was advised to call back or seek an in-person evaluation if the symptoms worsen or if the condition fails to improve as anticipated.  I provided 12 minutes of non-face-to-face time during this encounter.   10/25/2022, MD

## 2022-11-24 NOTE — Assessment & Plan Note (Addendum)
Resolved with  change in  employment

## 2022-11-24 NOTE — Assessment & Plan Note (Signed)
Hyperlipidemia:Low fat diet discussed and encouraged.   Lipid Panel  Lab Results  Component Value Date   CHOL 192 10/23/2022   HDL 73 10/23/2022   LDLCALC 108 (H) 10/23/2022   TRIG 60 10/23/2022   CHOLHDL 2.6 10/23/2022     Neds to reduce fat intake

## 2022-12-01 ENCOUNTER — Ambulatory Visit (INDEPENDENT_AMBULATORY_CARE_PROVIDER_SITE_OTHER): Payer: 59

## 2022-12-01 DIAGNOSIS — J309 Allergic rhinitis, unspecified: Secondary | ICD-10-CM

## 2022-12-04 IMAGING — DX DG CHEST 1V PORT
1 series · 1 of 1 positions shown · non-contrast
Comparison: None.

CLINICAL DATA: Chest pain and shortness of breath.

EXAM:
PORTABLE CHEST 1 VIEW

[chest ap]
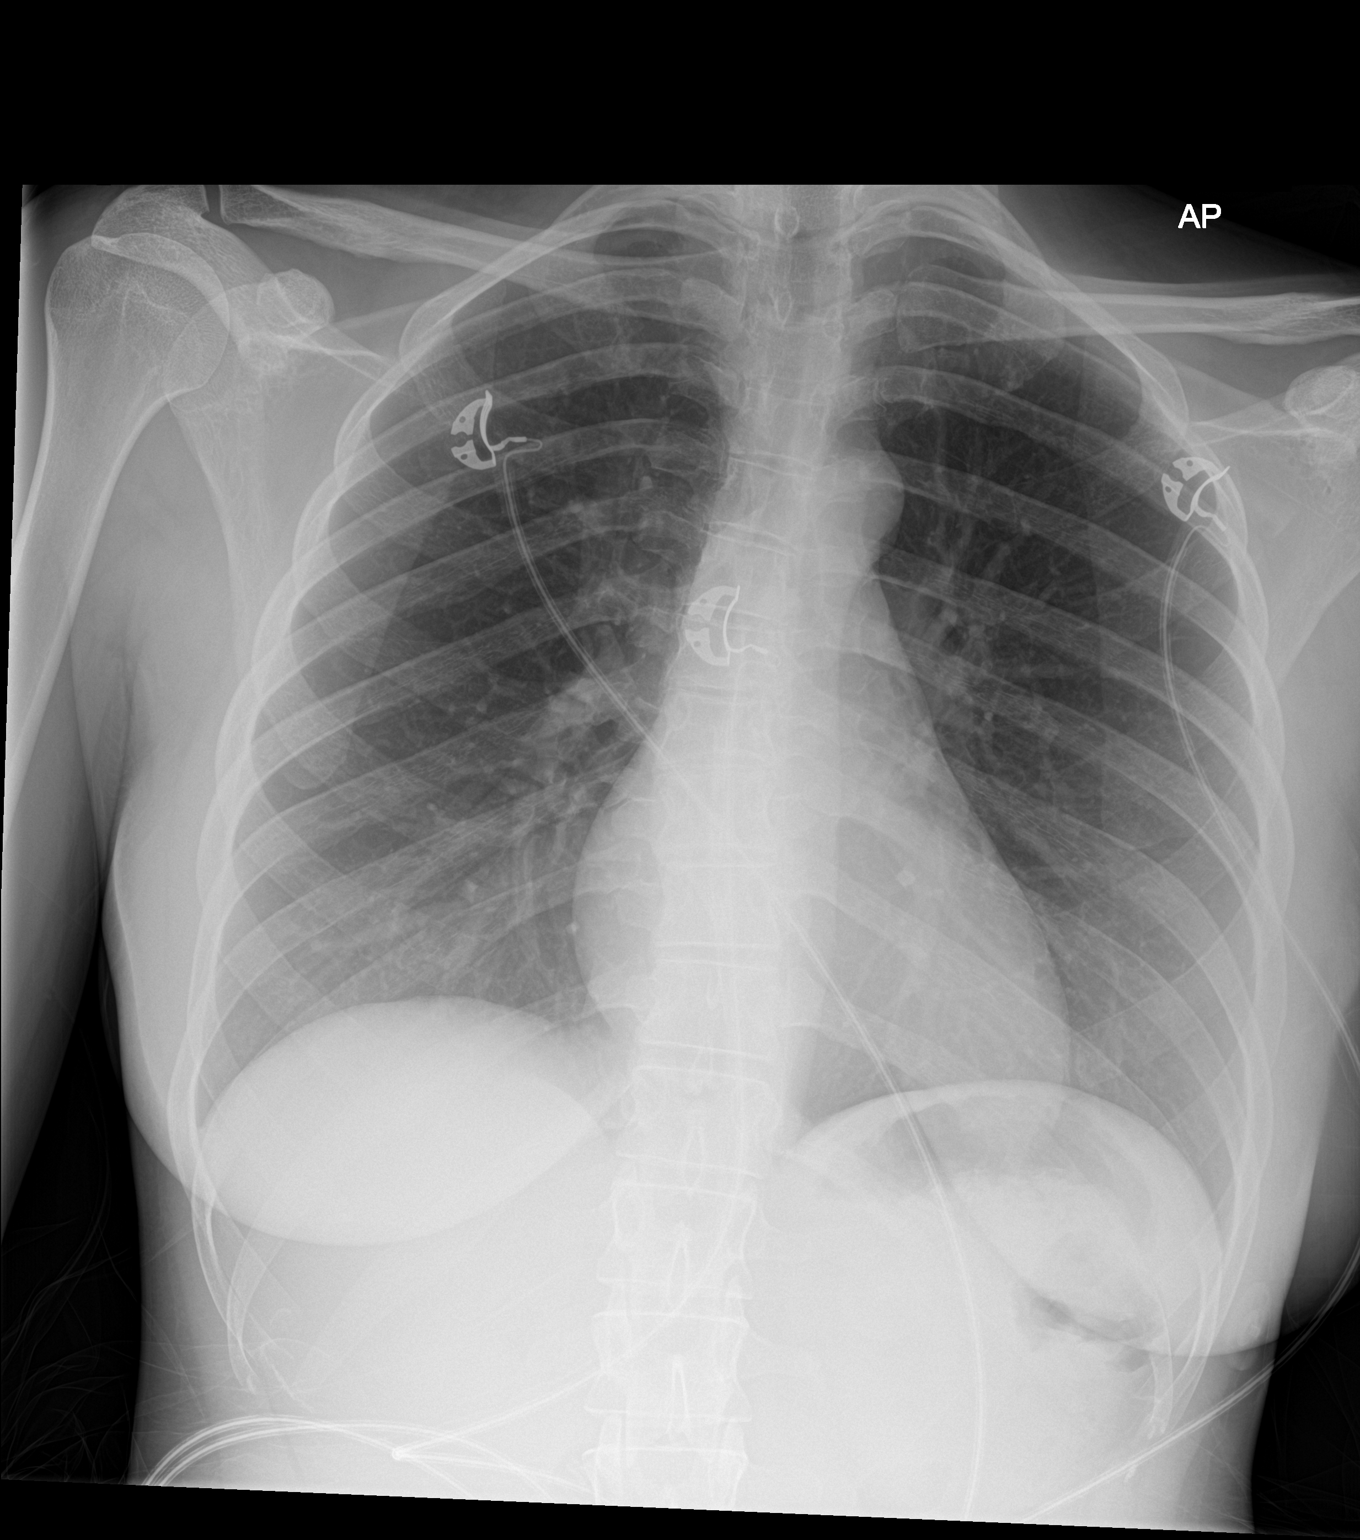

[1 of 1 positions shown; findings below may reference images not displayed]

FINDINGS: The cardiomediastinal contours are normal. The lungs are clear.
Pulmonary vasculature is normal. No consolidation, pleural effusion,
or pneumothorax. No acute osseous abnormalities are seen.
IMPRESSION: Negative AP view of the chest.

## 2022-12-05 IMAGING — DX DG CHEST 2V
2 series · 2 of 2 positions shown · non-contrast
Comparison: Chest radiograph dated 05/22/2021.

CLINICAL DATA: 30-year-old female with chest pain.

EXAM:
CHEST - 2 VIEW

[chest pa]
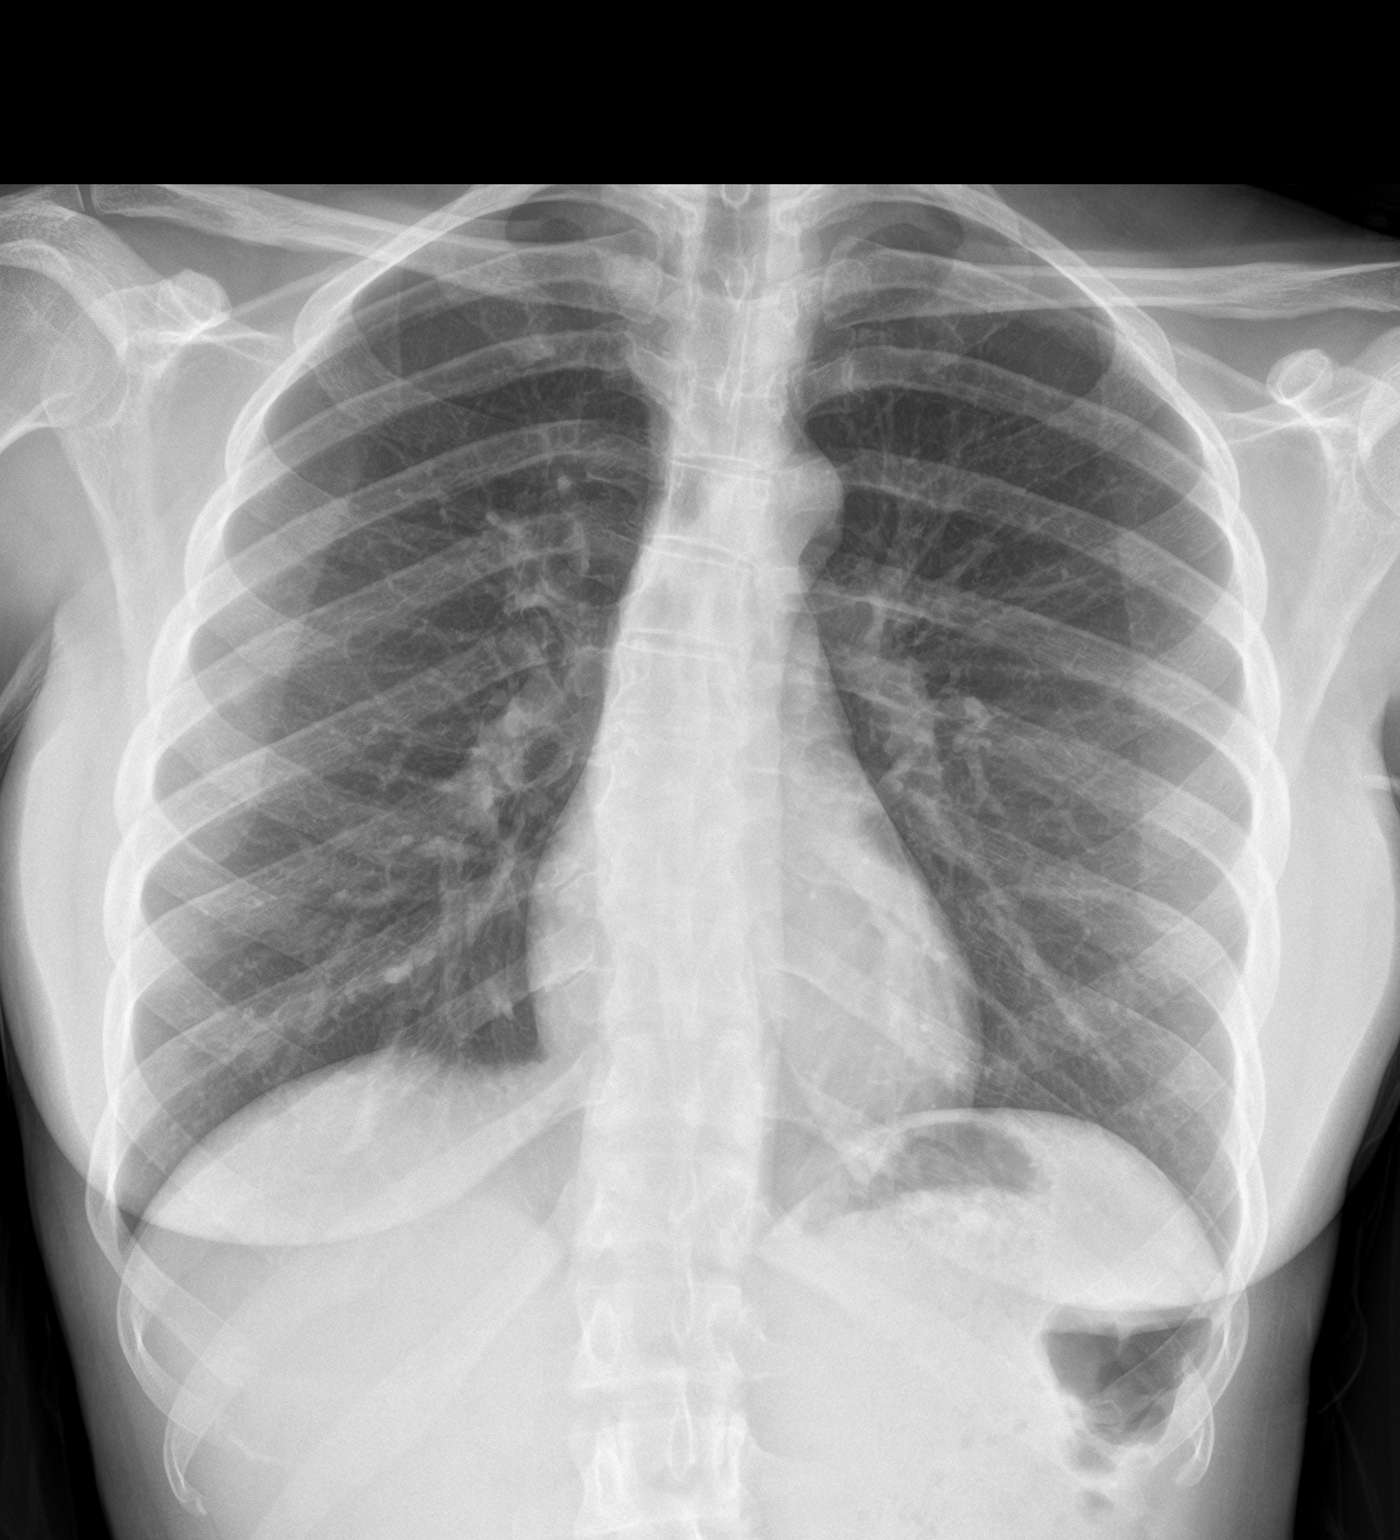

[chest lat]
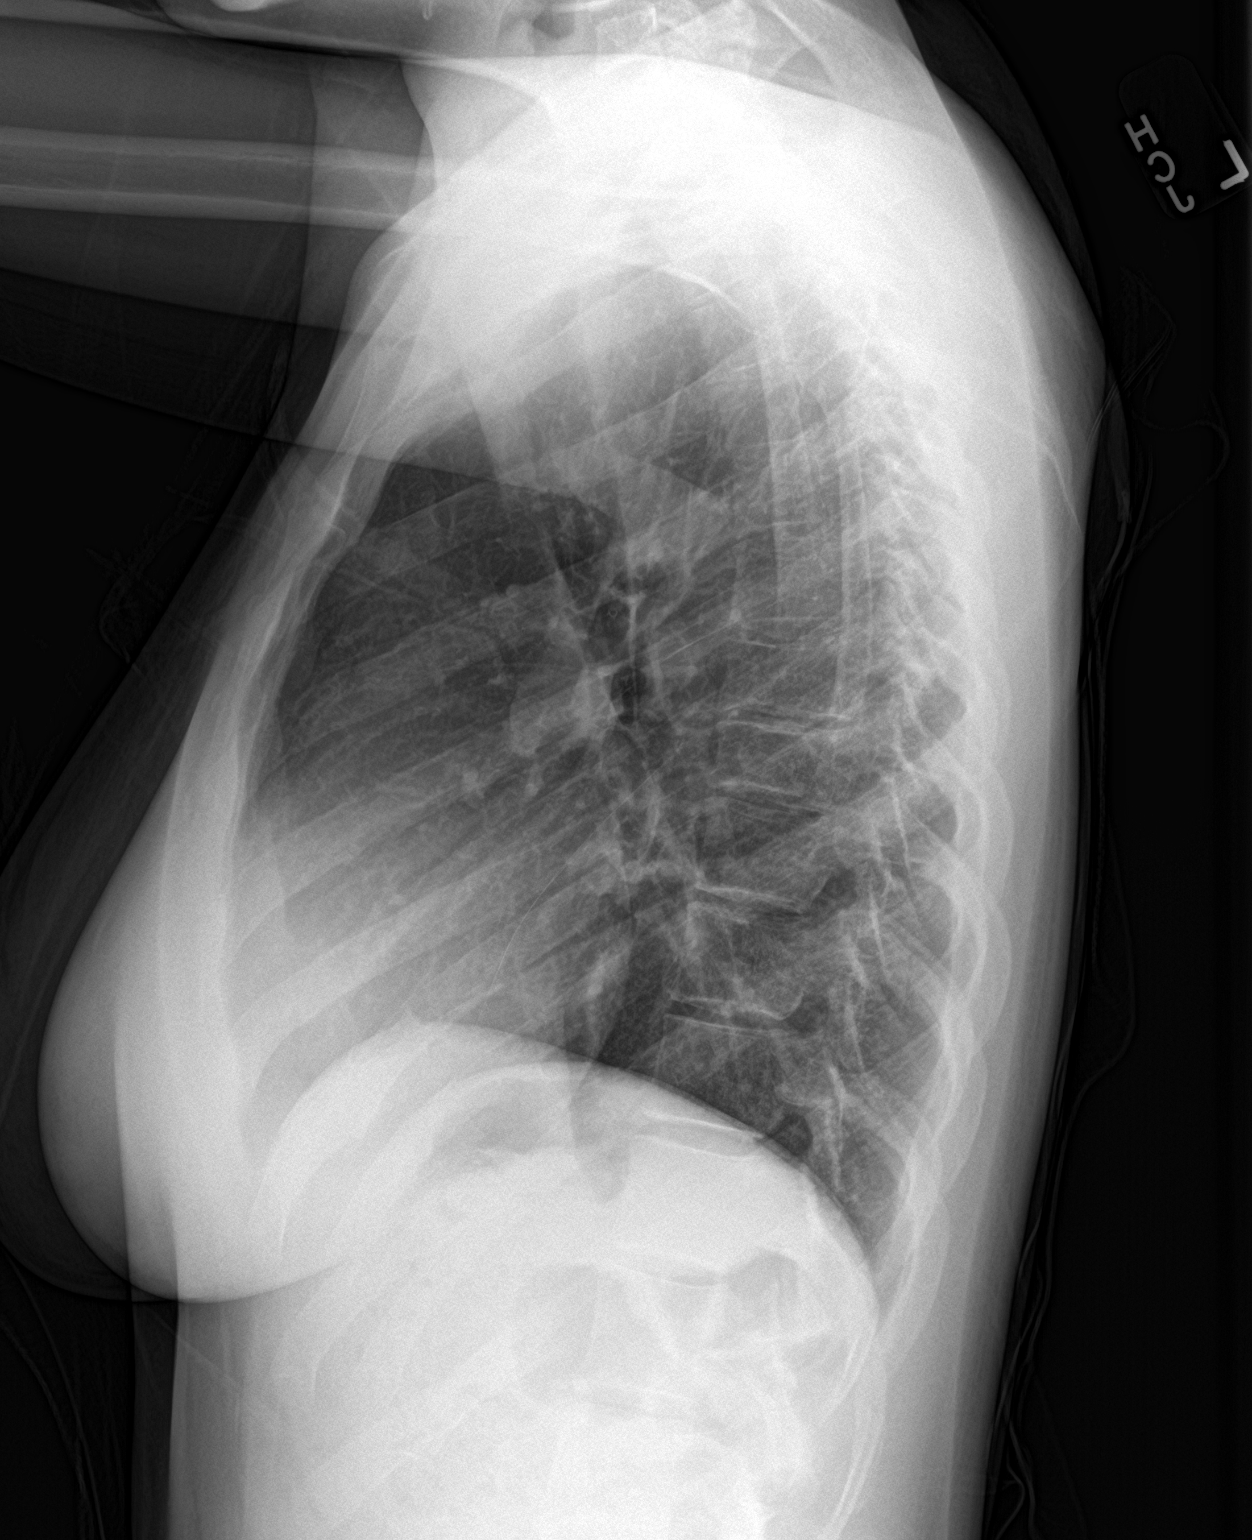

[2 of 2 positions shown; findings below may reference images not displayed]

FINDINGS: The heart size and mediastinal contours are within normal limits.
Both lungs are clear. The visualized skeletal structures are
unremarkable.
IMPRESSION: No active cardiopulmonary disease.

## 2022-12-17 ENCOUNTER — Ambulatory Visit (INDEPENDENT_AMBULATORY_CARE_PROVIDER_SITE_OTHER): Payer: 59

## 2022-12-17 ENCOUNTER — Other Ambulatory Visit: Payer: Self-pay | Admitting: Allergy

## 2022-12-17 DIAGNOSIS — J309 Allergic rhinitis, unspecified: Secondary | ICD-10-CM | POA: Diagnosis not present

## 2022-12-24 ENCOUNTER — Ambulatory Visit (INDEPENDENT_AMBULATORY_CARE_PROVIDER_SITE_OTHER): Payer: 59

## 2022-12-24 DIAGNOSIS — J309 Allergic rhinitis, unspecified: Secondary | ICD-10-CM | POA: Diagnosis not present

## 2022-12-31 ENCOUNTER — Ambulatory Visit (INDEPENDENT_AMBULATORY_CARE_PROVIDER_SITE_OTHER): Payer: 59

## 2022-12-31 DIAGNOSIS — J309 Allergic rhinitis, unspecified: Secondary | ICD-10-CM

## 2023-01-07 ENCOUNTER — Ambulatory Visit (INDEPENDENT_AMBULATORY_CARE_PROVIDER_SITE_OTHER): Payer: 59

## 2023-01-07 DIAGNOSIS — J309 Allergic rhinitis, unspecified: Secondary | ICD-10-CM

## 2023-01-21 ENCOUNTER — Ambulatory Visit (INDEPENDENT_AMBULATORY_CARE_PROVIDER_SITE_OTHER): Payer: 59

## 2023-01-21 DIAGNOSIS — J309 Allergic rhinitis, unspecified: Secondary | ICD-10-CM | POA: Diagnosis not present

## 2023-01-25 ENCOUNTER — Encounter: Payer: Self-pay | Admitting: Allergy

## 2023-01-25 MED ORDER — FAMOTIDINE 20 MG PO TABS: 20.0000 mg | ORAL_TABLET | Freq: Two times a day (BID) | ORAL | 0 refills | Status: DC

## 2023-01-25 NOTE — Addendum Note (Signed)
Addended by: Garnet Sierras on: 01/25/2023 09:51 AM   Modules accepted: Orders

## 2023-02-04 ENCOUNTER — Other Ambulatory Visit: Payer: Self-pay | Admitting: Family Medicine

## 2023-02-04 ENCOUNTER — Ambulatory Visit (INDEPENDENT_AMBULATORY_CARE_PROVIDER_SITE_OTHER): Payer: 59

## 2023-02-04 DIAGNOSIS — J309 Allergic rhinitis, unspecified: Secondary | ICD-10-CM | POA: Diagnosis not present

## 2023-02-06 ENCOUNTER — Encounter: Payer: Self-pay | Admitting: Family Medicine

## 2023-02-08 ENCOUNTER — Encounter: Payer: Self-pay | Admitting: Family Medicine

## 2023-02-16 ENCOUNTER — Other Ambulatory Visit (HOSPITAL_COMMUNITY): Payer: Self-pay

## 2023-02-18 ENCOUNTER — Ambulatory Visit (INDEPENDENT_AMBULATORY_CARE_PROVIDER_SITE_OTHER): Payer: 59

## 2023-02-18 DIAGNOSIS — J309 Allergic rhinitis, unspecified: Secondary | ICD-10-CM

## 2023-02-23 ENCOUNTER — Ambulatory Visit (INDEPENDENT_AMBULATORY_CARE_PROVIDER_SITE_OTHER): Payer: 59

## 2023-02-23 DIAGNOSIS — J309 Allergic rhinitis, unspecified: Secondary | ICD-10-CM

## 2023-03-02 DIAGNOSIS — J302 Other seasonal allergic rhinitis: Secondary | ICD-10-CM | POA: Diagnosis not present

## 2023-03-02 NOTE — Progress Notes (Signed)
VIALS EXP 03-01-24

## 2023-03-09 DIAGNOSIS — J301 Allergic rhinitis due to pollen: Secondary | ICD-10-CM | POA: Diagnosis not present

## 2023-03-11 ENCOUNTER — Ambulatory Visit (INDEPENDENT_AMBULATORY_CARE_PROVIDER_SITE_OTHER): Payer: 59

## 2023-03-11 DIAGNOSIS — J309 Allergic rhinitis, unspecified: Secondary | ICD-10-CM

## 2023-03-23 ENCOUNTER — Encounter: Payer: Self-pay | Admitting: Family Medicine

## 2023-03-30 ENCOUNTER — Ambulatory Visit (INDEPENDENT_AMBULATORY_CARE_PROVIDER_SITE_OTHER): Payer: 59

## 2023-03-30 DIAGNOSIS — J309 Allergic rhinitis, unspecified: Secondary | ICD-10-CM

## 2023-04-12 ENCOUNTER — Other Ambulatory Visit: Payer: Self-pay | Admitting: Allergy

## 2023-04-15 ENCOUNTER — Ambulatory Visit (INDEPENDENT_AMBULATORY_CARE_PROVIDER_SITE_OTHER): Payer: 59

## 2023-04-15 DIAGNOSIS — J309 Allergic rhinitis, unspecified: Secondary | ICD-10-CM

## 2023-04-22 ENCOUNTER — Telehealth: Payer: Self-pay

## 2023-04-22 ENCOUNTER — Ambulatory Visit (INDEPENDENT_AMBULATORY_CARE_PROVIDER_SITE_OTHER): Payer: 59

## 2023-04-22 DIAGNOSIS — J309 Allergic rhinitis, unspecified: Secondary | ICD-10-CM

## 2023-04-22 MED ORDER — MONTELUKAST SODIUM 10 MG PO TABS: ORAL_TABLET | ORAL | 1 refills | Status: DC

## 2023-04-22 NOTE — Telephone Encounter (Signed)
Patient needs refill on Montelukast [facility] in [place]

## 2023-04-29 ENCOUNTER — Ambulatory Visit (INDEPENDENT_AMBULATORY_CARE_PROVIDER_SITE_OTHER): Payer: 59

## 2023-04-29 DIAGNOSIS — J309 Allergic rhinitis, unspecified: Secondary | ICD-10-CM | POA: Diagnosis not present

## 2023-05-06 ENCOUNTER — Ambulatory Visit (INDEPENDENT_AMBULATORY_CARE_PROVIDER_SITE_OTHER): Payer: 59

## 2023-05-06 DIAGNOSIS — J309 Allergic rhinitis, unspecified: Secondary | ICD-10-CM | POA: Diagnosis not present

## 2023-05-13 ENCOUNTER — Ambulatory Visit (INDEPENDENT_AMBULATORY_CARE_PROVIDER_SITE_OTHER): Payer: 59

## 2023-05-13 DIAGNOSIS — J309 Allergic rhinitis, unspecified: Secondary | ICD-10-CM | POA: Diagnosis not present

## 2023-05-25 ENCOUNTER — Ambulatory Visit (INDEPENDENT_AMBULATORY_CARE_PROVIDER_SITE_OTHER): Payer: 59

## 2023-05-25 DIAGNOSIS — J309 Allergic rhinitis, unspecified: Secondary | ICD-10-CM

## 2023-06-08 ENCOUNTER — Ambulatory Visit: Payer: 59 | Admitting: Family Medicine

## 2023-06-10 ENCOUNTER — Ambulatory Visit (INDEPENDENT_AMBULATORY_CARE_PROVIDER_SITE_OTHER): Payer: 59

## 2023-06-10 DIAGNOSIS — J309 Allergic rhinitis, unspecified: Secondary | ICD-10-CM

## 2023-06-22 ENCOUNTER — Ambulatory Visit: Payer: 59 | Admitting: Allergy

## 2023-06-22 ENCOUNTER — Encounter: Payer: Self-pay | Admitting: Allergy

## 2023-06-22 ENCOUNTER — Other Ambulatory Visit: Payer: Self-pay

## 2023-06-22 VITALS — BP 106/80 | HR 67 | Temp 97.9°F | Resp 16 | Ht 62.21 in | Wt 112.8 lb

## 2023-06-22 DIAGNOSIS — J3089 Other allergic rhinitis: Secondary | ICD-10-CM

## 2023-06-22 DIAGNOSIS — J302 Other seasonal allergic rhinitis: Secondary | ICD-10-CM

## 2023-06-22 DIAGNOSIS — H1010 Acute atopic conjunctivitis, unspecified eye: Secondary | ICD-10-CM

## 2023-06-22 DIAGNOSIS — H1013 Acute atopic conjunctivitis, bilateral: Secondary | ICD-10-CM

## 2023-06-22 MED ORDER — EPINEPHRINE 0.3 MG/0.3ML IJ SOAJ: 0.3000 mg | INTRAMUSCULAR | 2 refills | Status: DC | PRN

## 2023-06-22 NOTE — Progress Notes (Signed)
Follow Up Note  RE: Kelly [name] MRN: [id number] DOB: 29-Dec-1990 Date of Office Visit: 06/22/2023  Referring provider: Kerri Perches, MD Primary care provider: Kerri Perches, MD  Chief Complaint: No chief complaint on file.  History of Present Illness: I had the pleasure of seeing Kelly [name] for a follow up visit at the [facility] on 06/22/2023. She is a 32 y.o. female, who is being followed for allergic rhinoconjunctivitis on AIT. Her previous allergy office visit was on 02/10/2022 with Dr. Selena Batten. Today is a regular follow up visit.  Seasonal and perennial allergic rhinoconjunctivitis Patient currently on AIT and doing well on it. Localized reactions have improved.   Taking Claritin and benadryl the night before injection and Claritin the day of injection. Takes Singulair daily at night. Not needed to use nasal sprays or eye drops recently.  Assessment and Plan: Kelly [name] is a 32 y.o. female with: Seasonal and perennial allergic rhinoconjunctivitis Past history - Rhinoconjunctivitis symptoms for 20+ years mainly in the spring and fall.  Zyrtec caused nosebleeds. 2022 skin testing showed: Positive to grass pollen, weed, ragweed, trees, mold. Started AIT on 08/28/2021 (G-Rw-W-T & M). Interim history - some localized reactions with AIT. Symptoms improved. Continue environmental control measures.  Continue allergy injections - given today.  Double up on the antihistamines the day before, the day of and the day after injection. You can take Zyrtec, Claritin, Allegra or Xyzal.  May use topical hydrocortisone cream as needed on the rash.  For mild symptoms you can take over the counter antihistamines such as Benadryl and monitor symptoms closely. If symptoms worsen or if you have severe symptoms including breathing issues, throat closure, significant swelling, whole body hives, severe diarrhea and vomiting, lightheadedness then inject epinephrine and  seek immediate medical care afterwards. Use Flonase (fluticasone) nasal spray 1 spray per nostril twice a day as needed for nasal congestion.  Nasal saline spray (i.e., Simply Saline) or nasal saline lavage (i.e., NeilMed) is recommended as needed and prior to medicated nasal sprays. Continue Singulair (montelukast) 10mg  daily at night. You may try to stop in the fall/winter months.  May use olopatadine eye drops 0.2% once a day as needed for itchy/watery eyes.  Return in about 1 year (around 06/21/2024).  Meds ordered this encounter  Medications   DISCONTD: EPINEPHrine 0.3 mg/0.3 mL IJ SOAJ injection    Sig: Inject 0.3 mg into the muscle as needed for anaphylaxis.    Dispense:  1 each    Refill:  2    May dispense generic/Mylan/Teva brand.   EPINEPHrine 0.3 mg/0.3 mL IJ SOAJ injection    Sig: Inject 0.3 mg into the muscle as needed for anaphylaxis.    Dispense:  1 each    Refill:  2    May dispense generic/Mylan/Teva brand.   Lab Orders  No laboratory test(s) ordered today    Diagnostics: None.   Medication List:  Current Outpatient Medications  Medication Sig Dispense Refill   doxycycline (VIBRA-TABS) 100 MG tablet Take 1 tablet (100 mg total) by mouth daily. 90 tablet 1   fluticasone (FLONASE) 50 MCG/ACT nasal spray Use 2 sprays in each nostril BID for a week. After 1 week, decrease to 1 spray in each nostril BID as needed for congestion/allergies. 16 g 6   minoxidil (LONITEN) 2.5 MG tablet Takes half daily 30 tablet 2   montelukast (SINGULAIR) 10 MG tablet TAKE 1 TABLET BY MOUTH EVERYDAY AT BEDTIME 90  tablet 1   spironolactone (ALDACTONE) 50 MG tablet SMARTSIG:1 Pill By Mouth Every Morning     tretinoin (RETIN-A) 0.025 % cream Apply topically at bedtime. 45 g 0   EPINEPHrine 0.3 mg/0.3 mL IJ SOAJ injection Inject 0.3 mg into the muscle as needed for anaphylaxis. 1 each 2   No current facility-administered medications for this visit.   Allergies: Allergies  Allergen  Reactions   Penicillins Rash   I reviewed her past medical history, social history, family history, and environmental history and no significant changes have been reported from her previous visit.  Review of Systems  Constitutional:  Negative for appetite change, chills, fever and unexpected weight change.  HENT:  Negative for congestion, rhinorrhea and sneezing.   Eyes:  Negative for itching.  Respiratory:  Negative for cough, chest tightness, shortness of breath and wheezing.   Cardiovascular:  Negative for chest pain.  Gastrointestinal:  Negative for abdominal pain.  Genitourinary:  Negative for difficulty urinating.  Skin:  Negative for rash.  Allergic/Immunologic: Positive for environmental allergies.  Neurological:  Negative for headaches.    Objective: BP 106/80   Pulse 67   Temp 97.9 F (36.6 C)   Resp 16   Ht 5' 2.21" (1.58 m)   Wt 112 lb 12 oz (51.1 kg)   SpO2 95%   BMI 20.49 kg/m  Body mass index is 20.49 kg/m. Physical Exam Vitals and nursing note reviewed.  Constitutional:      Appearance: Normal appearance. She is well-developed.  HENT:     Head: Normocephalic and atraumatic.     Right Ear: Tympanic membrane and external ear normal.     Left Ear: Tympanic membrane and external ear normal.     Nose: Nose normal.     Mouth/Throat:     Mouth: Mucous membranes are moist.     Pharynx: Oropharynx is clear.  Eyes:     Conjunctiva/sclera: Conjunctivae normal.  Cardiovascular:     Rate and Rhythm: Normal rate and regular rhythm.     Heart sounds: Normal heart sounds. No murmur heard.    No friction rub. No gallop.  Pulmonary:     Effort: Pulmonary effort is normal.     Breath sounds: Normal breath sounds. No wheezing, rhonchi or rales.  Musculoskeletal:     Cervical back: Neck supple.  Skin:    General: Skin is warm.     Findings: No rash.  Neurological:     Mental Status: She is alert and oriented to person, place, and time.  Psychiatric:         Behavior: Behavior normal.    Previous notes and tests were reviewed. The plan was reviewed with the patient/family, and all questions/concerned were addressed.  It was my pleasure to see Kelly [name] today and participate in her care. Please feel free to contact me with any questions or concerns.  Sincerely,  Wyline Mood, DO Allergy & Immunology  [facility] office: [phone] [facility] office: [phone]

## 2023-06-22 NOTE — Patient Instructions (Addendum)
Environmental allergies 2022 skin testing showed: Positive to grass pollen, weed, ragweed, trees, mold. Continue environmental control measures.  Continue allergy injections - given today.  Double up on the antihistamines the day before, the day of and the day after injection. You can take Zyrtec, Claritin, Allegra or Xyzal.  May use topical hydrocortisone cream as needed on the rash.  For mild symptoms you can take over the counter antihistamines such as Benadryl and monitor symptoms closely. If symptoms worsen or if you have severe symptoms including breathing issues, throat closure, significant swelling, whole body hives, severe diarrhea and vomiting, lightheadedness then inject epinephrine and seek immediate medical care afterwards.  Use Flonase (fluticasone) nasal spray 1 spray per nostril twice a day as needed for nasal congestion.  Nasal saline spray (i.e., Simply Saline) or nasal saline lavage (i.e., NeilMed) is recommended as needed and prior to medicated nasal sprays. Continue Singulair (montelukast) 10mg  daily at night. You may try to stop in the fall/winter months.  May use olopatadine eye drops 0.2% once a day as needed for itchy/watery eyes.  Follow up in 12 months or sooner if needed.

## 2023-06-22 NOTE — Assessment & Plan Note (Signed)
Past history - Rhinoconjunctivitis symptoms for 20+ years mainly in the spring and fall.  Zyrtec caused nosebleeds. 2022 skin testing showed: Positive to grass pollen, weed, ragweed, trees, mold. Started AIT on 08/28/2021 (G-Rw-W-T & M). Interim history - some localized reactions with AIT. Symptoms improved. Continue environmental control measures.  Continue allergy injections - given today.  Double up on the antihistamines the day before, the day of and the day after injection. You can take Zyrtec, Claritin, Allegra or Xyzal.  May use topical hydrocortisone cream as needed on the rash.  For mild symptoms you can take over the counter antihistamines such as Benadryl and monitor symptoms closely. If symptoms worsen or if you have severe symptoms including breathing issues, throat closure, significant swelling, whole body hives, severe diarrhea and vomiting, lightheadedness then inject epinephrine and seek immediate medical care afterwards. Use Flonase (fluticasone) nasal spray 1 spray per nostril twice a day as needed for nasal congestion.  Nasal saline spray (i.e., Simply Saline) or nasal saline lavage (i.e., NeilMed) is recommended as needed and prior to medicated nasal sprays. Continue Singulair (montelukast) 10mg  daily at night. You may try to stop in the fall/winter months.  May use olopatadine eye drops 0.2% once a day as needed for itchy/watery eyes.

## 2023-07-07 DIAGNOSIS — J301 Allergic rhinitis due to pollen: Secondary | ICD-10-CM | POA: Diagnosis not present

## 2023-07-07 NOTE — Progress Notes (Signed)
VIALS EXP 07-06-24 

## 2023-07-13 ENCOUNTER — Ambulatory Visit: Payer: 59 | Admitting: Family Medicine

## 2023-07-22 ENCOUNTER — Telehealth: Payer: Self-pay | Admitting: Allergy

## 2023-07-22 ENCOUNTER — Ambulatory Visit (INDEPENDENT_AMBULATORY_CARE_PROVIDER_SITE_OTHER): Payer: 59

## 2023-07-22 DIAGNOSIS — J309 Allergic rhinitis, unspecified: Secondary | ICD-10-CM

## 2023-07-22 NOTE — Telephone Encounter (Signed)
Flowsheet has been updated to reflect this change.

## 2023-07-22 NOTE — Telephone Encounter (Signed)
Patient called stating that about 1.5 hours after her injection she noted one sided throat discomfort. No rash/itching/swelling.  She did take her antihistamines and Singulair as normally she would.   She took benadryl 25mg  about 30 min ago with slight improvement. Still able to swallow with no issues.   Talking in full sentences with no distress over the phone.   Advised patient to take another Claritin as that what's she has at home and famotidine 40mg  (which she sometimes takes for heartburn).   If symptoms worsen significantly then advised her to inject epi and go to the ER.

## 2023-07-23 DIAGNOSIS — J302 Other seasonal allergic rhinitis: Secondary | ICD-10-CM | POA: Diagnosis not present

## 2023-08-03 ENCOUNTER — Ambulatory Visit (INDEPENDENT_AMBULATORY_CARE_PROVIDER_SITE_OTHER): Payer: 59

## 2023-08-03 DIAGNOSIS — J309 Allergic rhinitis, unspecified: Secondary | ICD-10-CM

## 2023-08-12 ENCOUNTER — Ambulatory Visit (INDEPENDENT_AMBULATORY_CARE_PROVIDER_SITE_OTHER): Payer: 59

## 2023-08-12 DIAGNOSIS — J309 Allergic rhinitis, unspecified: Secondary | ICD-10-CM

## 2023-08-13 ENCOUNTER — Ambulatory Visit (INDEPENDENT_AMBULATORY_CARE_PROVIDER_SITE_OTHER): Payer: 59 | Admitting: Family Medicine

## 2023-08-13 ENCOUNTER — Encounter: Payer: Self-pay | Admitting: Family Medicine

## 2023-08-13 VITALS — BP 113/69 | HR 74 | Ht 62.0 in | Wt 111.0 lb

## 2023-08-13 DIAGNOSIS — L700 Acne vulgaris: Secondary | ICD-10-CM

## 2023-08-13 DIAGNOSIS — E785 Hyperlipidemia, unspecified: Secondary | ICD-10-CM | POA: Diagnosis not present

## 2023-08-13 DIAGNOSIS — H1010 Acute atopic conjunctivitis, unspecified eye: Secondary | ICD-10-CM

## 2023-08-13 DIAGNOSIS — F322 Major depressive disorder, single episode, severe without psychotic features: Secondary | ICD-10-CM

## 2023-08-13 DIAGNOSIS — J3089 Other allergic rhinitis: Secondary | ICD-10-CM

## 2023-08-13 DIAGNOSIS — J302 Other seasonal allergic rhinitis: Secondary | ICD-10-CM

## 2023-08-13 DIAGNOSIS — L658 Other specified nonscarring hair loss: Secondary | ICD-10-CM

## 2023-08-13 DIAGNOSIS — R7989 Other specified abnormal findings of blood chemistry: Secondary | ICD-10-CM | POA: Diagnosis not present

## 2023-08-13 DIAGNOSIS — E559 Vitamin D deficiency, unspecified: Secondary | ICD-10-CM

## 2023-08-13 DIAGNOSIS — Z1321 Encounter for screening for nutritional disorder: Secondary | ICD-10-CM

## 2023-08-13 NOTE — Patient Instructions (Addendum)
F/U in 6  months, call if you need me sooner  Nurse pls measure and document waist circumference  Fasting CBC, lipid, cmp and EGFR, TSH and vit D   It is important that you exercise regularly at least 30 minutes 5 times a week. If you develop chest pain, have severe difficulty breathing, or feel very tired, stop exercising immediately and seek medical attention     Think about what you will eat, plan ahead. Choose " clean, green, fresh or frozen" over canned, processed or packaged foods which are more sugary, salty and fatty. 70 to 75% of food eaten should be vegetables and fruit. Three meals at set times with snacks allowed between meals, but they must be fruit or vegetables. Aim to eat over a 12 hour period , example 7 am to 7 pm, and STOP after  your last meal of the day. Drink water,generally about 64 ounces per day, no other drink is as healthy. Fruit juice is best enjoyed in a healthy way, by EATING the fruit.

## 2023-08-17 ENCOUNTER — Ambulatory Visit (INDEPENDENT_AMBULATORY_CARE_PROVIDER_SITE_OTHER): Payer: 59 | Admitting: *Deleted

## 2023-08-17 DIAGNOSIS — J309 Allergic rhinitis, unspecified: Secondary | ICD-10-CM

## 2023-08-19 ENCOUNTER — Encounter: Payer: Self-pay | Admitting: Family Medicine

## 2023-08-19 NOTE — Assessment & Plan Note (Signed)
Well controlled  on immunotherapy and daily Singulair

## 2023-08-19 NOTE — Progress Notes (Signed)
   Kelly [name]     MRN: [id number]      DOB: Feb 07, 1991  No chief complaint on file.   HPI Ms. Trust is here for follow up and re-evaluation of chronic medical conditions, medication management and review of any available recent lab and radiology data.  Preventive health is updated, specifically  Cancer screening and Immunization.   Questions or concerns regarding consultations or procedures which the PT has had in the interim are  addressed. The PT denies any adverse reactions to current medications since the last visit.  There are no new concerns.  There are no specific complaints  Doing very well overall. She is followed by allergist and dermatology  ROS Denies recent fever or chills. Denies sinus pressure, nasal congestion, ear pain or sore throat. Denies chest congestion, productive cough or wheezing. Denies chest pains, palpitations and leg swelling Denies abdominal pain, nausea, vomiting,diarrhea or constipation.   Denies dysuria, frequency, hesitancy or incontinence. Denies joint pain, swelling and limitation in mobility. Denies headaches, seizures, numbness, or tingling. Denies depression, anxiety or insomnia. Denies skin break down or rash.   PE  BP 113/69   Pulse 74   Ht 5\' 2"  (1.575 m)   Wt 111 lb (50.3 kg)   SpO2 96%   BMI 20.30 kg/m   Patient alert and oriented and in no cardiopulmonary distress.  HEENT: No facial asymmetry, EOMI,     Neck supple .  Chest: Clear to auscultation bilaterally.  CVS: S1, S2 no murmurs, no S3.Regular rate.  ABD: Soft non tender.   Ext: No edema  MS: Adequate ROM spine, shoulders, hips and knees.  Skin: Intact, no ulcerations or rash noted.  Psych: Good eye contact, normal affect. Memory intact not anxious or depressed appearing.  CNS: CN 2-12 intact, power,  normal throughout.no focal deficits noted.   Assessment & Plan  Acne vulgaris Managed by dermatology and well controlled  on current regime  Traction  alopecia Has tran sitioned to oral minoxidil, no topical therapy anymore  Seasonal and perennial allergic rhinoconjunctivitis Well controlled  on immunotherapy and daily Singulair  Hyperlipidemia LDL goal <100 Hyperlipidemia:Low fat diet discussed and encouraged.   Lipid Panel  Lab Results  Component Value Date   CHOL 192 10/23/2022   HDL 73 10/23/2022   LDLCALC 108 (H) 10/23/2022   TRIG 60 10/23/2022   CHOLHDL 2.6 10/23/2022     Updated lab needed at/ before next visit.   Low vitamin D level Updated lab needed at/ before next visit.   Anxiety Resolved, situation has changed, new job  Depression, major, single episode, severe (HCC) Resolved with change in employment

## 2023-08-19 NOTE — Assessment & Plan Note (Signed)
Managed by dermatology and well controlled  on current regime

## 2023-08-19 NOTE — Assessment & Plan Note (Signed)
Resolved, situation has changed, new job

## 2023-08-19 NOTE — Assessment & Plan Note (Signed)
Updated lab needed at/ before next visit.   

## 2023-08-19 NOTE — Assessment & Plan Note (Signed)
Resolved with  change in  employment

## 2023-08-19 NOTE — Assessment & Plan Note (Signed)
Has tran sitioned to oral minoxidil, no topical therapy anymore

## 2023-08-19 NOTE — Assessment & Plan Note (Signed)
Hyperlipidemia:Low fat diet discussed and encouraged.   Lipid Panel  Lab Results  Component Value Date   CHOL 192 10/23/2022   HDL 73 10/23/2022   LDLCALC 108 (H) 10/23/2022   TRIG 60 10/23/2022   CHOLHDL 2.6 10/23/2022     Updated lab needed at/ before next visit.

## 2023-08-24 ENCOUNTER — Ambulatory Visit: Payer: 59

## 2023-08-24 DIAGNOSIS — J309 Allergic rhinitis, unspecified: Secondary | ICD-10-CM | POA: Diagnosis not present

## 2023-08-28 LAB — CMP14+EGFR
ALT: 12 IU/L (ref 0–32)
AST: 22 IU/L (ref 0–40)
Albumin: 4.3 g/dL (ref 4.0–5.0)
Alkaline Phosphatase: 37 IU/L — ABNORMAL LOW (ref 44–121)
BUN/Creatinine Ratio: 19 (ref 9–23)
BUN: 13 mg/dL (ref 6–20)
Bilirubin Total: 0.5 mg/dL (ref 0.0–1.2)
CO2: 22 mmol/L (ref 20–29)
Calcium: 9.2 mg/dL (ref 8.7–10.2)
Chloride: 102 mmol/L (ref 96–106)
Creatinine, Ser: 0.69 mg/dL (ref 0.57–1.00)
Globulin, Total: 3 g/dL (ref 1.5–4.5)
Glucose: 89 mg/dL (ref 70–99)
Potassium: 4 mmol/L (ref 3.5–5.2)
Sodium: 138 mmol/L (ref 134–144)
Total Protein: 7.3 g/dL (ref 6.0–8.5)
eGFR: 120 mL/min/{1.73_m2} (ref >59)

## 2023-08-28 LAB — CBC
Hematocrit: 36.8 % (ref 34.0–46.6)
Hemoglobin: 12 g/dL (ref 11.1–15.9)
MCH: 31.7 pg (ref 26.6–33.0)
MCHC: 32.6 g/dL (ref 31.5–35.7)
MCV: 97 fL (ref 79–97)
Platelets: 254 10*3/uL (ref 150–450)
RBC: 3.79 x10E6/uL (ref 3.77–5.28)
RDW: 11.6 % — ABNORMAL LOW (ref 11.7–15.4)
WBC: 3.7 10*3/uL (ref 3.4–10.8)

## 2023-08-28 LAB — LIPID PANEL
Chol/HDL Ratio: 2.4 ratio (ref 0.0–4.4)
Cholesterol, Total: 197 mg/dL (ref 100–199)
HDL: 81 mg/dL (ref >39)
LDL Chol Calc (NIH): 103 mg/dL — ABNORMAL HIGH (ref 0–99)
Triglycerides: 73 mg/dL (ref 0–149)
VLDL Cholesterol Cal: 13 mg/dL (ref 5–40)

## 2023-08-28 LAB — TSH+FREE T4
Free T4: 0.94 ng/dL (ref 0.82–1.77)
TSH: 1.36 u[IU]/mL (ref 0.450–4.500)

## 2023-08-28 LAB — VITAMIN D 25 HYDROXY (VIT D DEFICIENCY, FRACTURES): Vit D, 25-Hydroxy: 26.5 ng/mL — ABNORMAL LOW (ref 30.0–100.0)

## 2023-09-02 ENCOUNTER — Ambulatory Visit (INDEPENDENT_AMBULATORY_CARE_PROVIDER_SITE_OTHER): Payer: 59

## 2023-09-02 DIAGNOSIS — J309 Allergic rhinitis, unspecified: Secondary | ICD-10-CM

## 2023-09-16 ENCOUNTER — Ambulatory Visit (INDEPENDENT_AMBULATORY_CARE_PROVIDER_SITE_OTHER): Payer: 59

## 2023-09-16 DIAGNOSIS — J309 Allergic rhinitis, unspecified: Secondary | ICD-10-CM | POA: Diagnosis not present

## 2023-09-23 ENCOUNTER — Ambulatory Visit: Payer: 59

## 2023-09-23 DIAGNOSIS — J309 Allergic rhinitis, unspecified: Secondary | ICD-10-CM | POA: Diagnosis not present

## 2023-09-30 ENCOUNTER — Ambulatory Visit (INDEPENDENT_AMBULATORY_CARE_PROVIDER_SITE_OTHER): Payer: 59

## 2023-09-30 DIAGNOSIS — J309 Allergic rhinitis, unspecified: Secondary | ICD-10-CM

## 2023-10-01 ENCOUNTER — Telehealth: Payer: Self-pay | Admitting: *Deleted

## 2023-10-01 NOTE — Telephone Encounter (Signed)
Pt did take claritin this morning after talking to ashleigh cma and waiting to se if she needs benadryl this afternoon

## 2023-10-01 NOTE — Telephone Encounter (Signed)
Patient received .4mL yesterday of her Red vial yesterday in [facility]. Shew states later that night she had swelling at the injection site between the size of a nickel and a quarter. She states that she felt weak in the left arm that she received her pollen inj in, which she states that she always does. She states that today she feels weak all over and it hurts to move. She denies any rash or difficulty breathing. She did take a claritin yesterday morning and benadryl that evening. I did advise to go ahead and take another claritin today. Will you please provide recommendation since Dr. Selena Batten is out of office today.

## 2023-10-07 ENCOUNTER — Ambulatory Visit (INDEPENDENT_AMBULATORY_CARE_PROVIDER_SITE_OTHER): Payer: 59

## 2023-10-07 DIAGNOSIS — J309 Allergic rhinitis, unspecified: Secondary | ICD-10-CM

## 2023-10-21 ENCOUNTER — Ambulatory Visit: Payer: 59

## 2023-10-21 DIAGNOSIS — J309 Allergic rhinitis, unspecified: Secondary | ICD-10-CM

## 2023-11-04 ENCOUNTER — Ambulatory Visit (INDEPENDENT_AMBULATORY_CARE_PROVIDER_SITE_OTHER): Payer: 59

## 2023-11-04 DIAGNOSIS — J309 Allergic rhinitis, unspecified: Secondary | ICD-10-CM

## 2023-11-16 ENCOUNTER — Ambulatory Visit (INDEPENDENT_AMBULATORY_CARE_PROVIDER_SITE_OTHER): Payer: 59

## 2023-11-16 DIAGNOSIS — J309 Allergic rhinitis, unspecified: Secondary | ICD-10-CM | POA: Diagnosis not present

## 2023-11-18 NOTE — Progress Notes (Signed)
VIALS EXP 11-17-24

## 2023-11-19 DIAGNOSIS — J301 Allergic rhinitis due to pollen: Secondary | ICD-10-CM | POA: Diagnosis not present

## 2023-12-02 ENCOUNTER — Ambulatory Visit: Payer: 59

## 2023-12-02 DIAGNOSIS — J309 Allergic rhinitis, unspecified: Secondary | ICD-10-CM | POA: Diagnosis not present

## 2024-01-06 ENCOUNTER — Ambulatory Visit (INDEPENDENT_AMBULATORY_CARE_PROVIDER_SITE_OTHER): Payer: 59

## 2024-01-06 DIAGNOSIS — J309 Allergic rhinitis, unspecified: Secondary | ICD-10-CM | POA: Diagnosis not present

## 2024-02-03 ENCOUNTER — Ambulatory Visit (INDEPENDENT_AMBULATORY_CARE_PROVIDER_SITE_OTHER): Payer: 59

## 2024-02-03 DIAGNOSIS — J309 Allergic rhinitis, unspecified: Secondary | ICD-10-CM | POA: Diagnosis not present

## 2024-02-08 ENCOUNTER — Ambulatory Visit (INDEPENDENT_AMBULATORY_CARE_PROVIDER_SITE_OTHER): Payer: 59

## 2024-02-08 DIAGNOSIS — J309 Allergic rhinitis, unspecified: Secondary | ICD-10-CM | POA: Diagnosis not present

## 2024-02-15 ENCOUNTER — Encounter: Payer: Self-pay | Admitting: Family Medicine

## 2024-02-15 ENCOUNTER — Ambulatory Visit: Payer: 59 | Admitting: Family Medicine

## 2024-02-15 VITALS — BP 120/79 | HR 72 | Ht 62.0 in | Wt 119.1 lb

## 2024-02-15 DIAGNOSIS — L658 Other specified nonscarring hair loss: Secondary | ICD-10-CM

## 2024-02-15 DIAGNOSIS — R7989 Other specified abnormal findings of blood chemistry: Secondary | ICD-10-CM

## 2024-02-15 DIAGNOSIS — L700 Acne vulgaris: Secondary | ICD-10-CM | POA: Diagnosis not present

## 2024-02-15 DIAGNOSIS — H1010 Acute atopic conjunctivitis, unspecified eye: Secondary | ICD-10-CM

## 2024-02-15 DIAGNOSIS — E785 Hyperlipidemia, unspecified: Secondary | ICD-10-CM

## 2024-02-15 DIAGNOSIS — J302 Other seasonal allergic rhinitis: Secondary | ICD-10-CM

## 2024-02-15 DIAGNOSIS — J3089 Other allergic rhinitis: Secondary | ICD-10-CM

## 2024-02-15 NOTE — Patient Instructions (Addendum)
 F/U  in 6  months, call if you need me soner  Fasting lipid, cmp and eGFR, cBC, TSH and vit d in next 1 week [facility], Kathryne Sharper (Nurse to order and message)  .It is important that you exercise regularly at least 30 minutes 5 times a week. If you develop chest pain, have severe difficulty breathing, or feel very tired, stop exercising immediately and seek medical attention  Think about what you will eat, plan ahead. Choose " clean, green, fresh or frozen" over canned, processed or packaged foods which are more sugary, salty and fatty. 70 to 75% of food eaten should be vegetables and fruit. Three meals at set times with snacks allowed between meals, but they must be fruit or vegetables. Aim to eat over a 12 hour period , example 7 am to 7 pm, and STOP after  your last meal of the day. Drink water,generally about 64 ounces per day, no other drink is as healthy. Fruit juice is best enjoyed in a healthy way, by EATING the fruit.

## 2024-02-17 ENCOUNTER — Telehealth: Payer: Self-pay | Admitting: General Practice

## 2024-02-17 NOTE — Telephone Encounter (Signed)
 Annual biometric screening  Noted Copied Sleeved (put in provider box)  Call patient when ready

## 2024-02-18 NOTE — Assessment & Plan Note (Signed)
 Controled on current regime

## 2024-02-18 NOTE — Assessment & Plan Note (Signed)
 Hyperlipidemia:Low fat diet discussed and encouraged.   Lipid Panel  Lab Results  Component Value Date   CHOL 197 08/27/2023   HDL 81 08/27/2023   LDLCALC 103 (H) 08/27/2023   TRIG 73 08/27/2023   CHOLHDL 2.4 08/27/2023     Updated lab needed at/ before next visit.

## 2024-02-18 NOTE — Progress Notes (Signed)
   Kelly [name]     MRN: [id number]      DOB: 1991-06-30  Chief Complaint  Patient presents with   Follow-up    Follow up    [name] is here for follow up and re-evaluation of chronic medical conditions, medication management and review of any available recent lab and radiology data.  Preventive health is updated, specifically  Cancer screening and Immunization.   Questions or concerns regarding consultations or procedures which the PT has had in the interim are  addressed. There are no new concerns.  Needs health form competed from this visit for  health insurance purposes There are no specific complaints  Doing well on maintainace treatment for acne  and alopecia and immunotherapy for chronic allergies Great health habits as far as exercise , nutrition / food choice , weight and sleep are reported Doing very well on her job  ROS Denies recent fever or chills. Denies sinus pressure, nasal congestion, ear pain or sore throat. Denies chest congestion, productive cough or wheezing. Denies chest pains, palpitations and leg swelling Denies abdominal pain, nausea, vomiting,diarrhea or constipation.   Denies dysuria, frequency, hesitancy or incontinence. Denies joint pain, swelling and limitation in mobility. Denies headaches, seizures, numbness, or tingling. Denies depression, anxiety or insomnia. Denies skin break down or rash.   PE  BP 120/79 (BP Location: Right Arm, Patient Position: Sitting, Cuff Size: Normal)   Pulse 72   Ht 5\' 2"  (1.575 m)   Wt 119 lb 1.9 oz (54 kg)   SpO2 98%   BMI 21.79 kg/m   Patient alert and oriented and in no cardiopulmonary distress.  HEENT: No facial asymmetry, EOMI,     Neck supple .  Chest: Clear to auscultation bilaterally.  CVS: S1, S2 no murmurs, no S3.Regular rate.  ABD: Soft non tender.   Ext: No edema  MS: Adequate ROM spine, shoulders, hips and knees.  Skin: Intact, no ulcerations or rash noted.  Psych: Good eye  contact, normal affect. Memory intact not anxious or depressed appearing.  CNS: CN 2-12 intact, power,  normal throughout.no focal deficits noted.   Assessment & Plan  Seasonal and perennial allergic rhinoconjunctivitis Doing well with immunotherapy, symptoms controlled  Traction alopecia Reports improvement with treatment  Acne vulgaris Controled on current regime  Low vitamin D level Updated lab needed at/ before next visit.   Hyperlipidemia LDL goal <100 Hyperlipidemia:Low fat diet discussed and encouraged.   Lipid Panel  Lab Results  Component Value Date   CHOL 197 08/27/2023   HDL 81 08/27/2023   LDLCALC 103 (H) 08/27/2023   TRIG 73 08/27/2023   CHOLHDL 2.4 08/27/2023     Updated lab needed at/ before next visit.

## 2024-02-18 NOTE — Assessment & Plan Note (Signed)
 Doing well with immunotherapy, symptoms controlled

## 2024-02-18 NOTE — Assessment & Plan Note (Signed)
 Reports improvement with treatment

## 2024-02-18 NOTE — Assessment & Plan Note (Signed)
 Updated lab needed at/ before next visit.

## 2024-02-24 ENCOUNTER — Ambulatory Visit (INDEPENDENT_AMBULATORY_CARE_PROVIDER_SITE_OTHER): Payer: 59

## 2024-02-24 DIAGNOSIS — J309 Allergic rhinitis, unspecified: Secondary | ICD-10-CM | POA: Diagnosis not present

## 2024-03-02 ENCOUNTER — Ambulatory Visit (INDEPENDENT_AMBULATORY_CARE_PROVIDER_SITE_OTHER)

## 2024-03-02 DIAGNOSIS — J309 Allergic rhinitis, unspecified: Secondary | ICD-10-CM | POA: Diagnosis not present

## 2024-04-13 ENCOUNTER — Ambulatory Visit (INDEPENDENT_AMBULATORY_CARE_PROVIDER_SITE_OTHER)

## 2024-04-13 DIAGNOSIS — J309 Allergic rhinitis, unspecified: Secondary | ICD-10-CM

## 2024-05-04 ENCOUNTER — Ambulatory Visit (INDEPENDENT_AMBULATORY_CARE_PROVIDER_SITE_OTHER)

## 2024-05-04 DIAGNOSIS — J309 Allergic rhinitis, unspecified: Secondary | ICD-10-CM

## 2024-05-18 ENCOUNTER — Ambulatory Visit (INDEPENDENT_AMBULATORY_CARE_PROVIDER_SITE_OTHER)

## 2024-05-18 DIAGNOSIS — J309 Allergic rhinitis, unspecified: Secondary | ICD-10-CM

## 2024-05-23 ENCOUNTER — Encounter: Payer: Self-pay | Admitting: Family Medicine

## 2024-05-23 NOTE — Progress Notes (Signed)
 VIALS MADE 05-23-24

## 2024-05-25 ENCOUNTER — Ambulatory Visit (INDEPENDENT_AMBULATORY_CARE_PROVIDER_SITE_OTHER)

## 2024-05-25 DIAGNOSIS — J309 Allergic rhinitis, unspecified: Secondary | ICD-10-CM | POA: Diagnosis not present

## 2024-05-26 DIAGNOSIS — J301 Allergic rhinitis due to pollen: Secondary | ICD-10-CM | POA: Diagnosis not present

## 2024-05-29 DIAGNOSIS — J302 Other seasonal allergic rhinitis: Secondary | ICD-10-CM | POA: Diagnosis not present

## 2024-05-30 ENCOUNTER — Ambulatory Visit (INDEPENDENT_AMBULATORY_CARE_PROVIDER_SITE_OTHER)

## 2024-05-30 DIAGNOSIS — J309 Allergic rhinitis, unspecified: Secondary | ICD-10-CM

## 2024-06-02 ENCOUNTER — Other Ambulatory Visit: Payer: Self-pay

## 2024-06-02 DIAGNOSIS — E785 Hyperlipidemia, unspecified: Secondary | ICD-10-CM

## 2024-06-02 DIAGNOSIS — Z1329 Encounter for screening for other suspected endocrine disorder: Secondary | ICD-10-CM

## 2024-06-02 DIAGNOSIS — R7989 Other specified abnormal findings of blood chemistry: Secondary | ICD-10-CM

## 2024-06-02 DIAGNOSIS — L700 Acne vulgaris: Secondary | ICD-10-CM

## 2024-06-08 ENCOUNTER — Ambulatory Visit: Admitting: Adult Health

## 2024-06-08 ENCOUNTER — Ambulatory Visit (INDEPENDENT_AMBULATORY_CARE_PROVIDER_SITE_OTHER)

## 2024-06-08 DIAGNOSIS — J309 Allergic rhinitis, unspecified: Secondary | ICD-10-CM

## 2024-06-13 ENCOUNTER — Ambulatory Visit (INDEPENDENT_AMBULATORY_CARE_PROVIDER_SITE_OTHER)

## 2024-06-13 DIAGNOSIS — J309 Allergic rhinitis, unspecified: Secondary | ICD-10-CM | POA: Diagnosis not present

## 2024-06-20 ENCOUNTER — Ambulatory Visit (INDEPENDENT_AMBULATORY_CARE_PROVIDER_SITE_OTHER)

## 2024-06-20 DIAGNOSIS — J309 Allergic rhinitis, unspecified: Secondary | ICD-10-CM | POA: Diagnosis not present

## 2024-07-04 ENCOUNTER — Ambulatory Visit (INDEPENDENT_AMBULATORY_CARE_PROVIDER_SITE_OTHER)

## 2024-07-04 DIAGNOSIS — J309 Allergic rhinitis, unspecified: Secondary | ICD-10-CM

## 2024-07-13 ENCOUNTER — Ambulatory Visit (INDEPENDENT_AMBULATORY_CARE_PROVIDER_SITE_OTHER)

## 2024-07-13 DIAGNOSIS — J309 Allergic rhinitis, unspecified: Secondary | ICD-10-CM | POA: Diagnosis not present

## 2024-07-25 ENCOUNTER — Ambulatory Visit: Admitting: Allergy

## 2024-07-27 ENCOUNTER — Ambulatory Visit (INDEPENDENT_AMBULATORY_CARE_PROVIDER_SITE_OTHER)

## 2024-07-27 DIAGNOSIS — J309 Allergic rhinitis, unspecified: Secondary | ICD-10-CM | POA: Diagnosis not present

## 2024-08-03 ENCOUNTER — Ambulatory Visit (INDEPENDENT_AMBULATORY_CARE_PROVIDER_SITE_OTHER)

## 2024-08-03 ENCOUNTER — Ambulatory Visit: Admitting: Adult Health

## 2024-08-03 DIAGNOSIS — J309 Allergic rhinitis, unspecified: Secondary | ICD-10-CM

## 2024-08-08 ENCOUNTER — Ambulatory Visit (INDEPENDENT_AMBULATORY_CARE_PROVIDER_SITE_OTHER)

## 2024-08-08 DIAGNOSIS — J309 Allergic rhinitis, unspecified: Secondary | ICD-10-CM | POA: Diagnosis not present

## 2024-08-15 ENCOUNTER — Encounter: Payer: Self-pay | Admitting: Family Medicine

## 2024-08-15 ENCOUNTER — Ambulatory Visit: Payer: 59 | Admitting: Family Medicine

## 2024-08-15 VITALS — BP 120/80 | HR 93 | Resp 16 | Ht 63.0 in | Wt 117.0 lb

## 2024-08-15 DIAGNOSIS — L700 Acne vulgaris: Secondary | ICD-10-CM

## 2024-08-15 DIAGNOSIS — R7989 Other specified abnormal findings of blood chemistry: Secondary | ICD-10-CM

## 2024-08-15 DIAGNOSIS — R748 Abnormal levels of other serum enzymes: Secondary | ICD-10-CM | POA: Insufficient documentation

## 2024-08-15 DIAGNOSIS — L658 Other specified nonscarring hair loss: Secondary | ICD-10-CM | POA: Diagnosis not present

## 2024-08-15 DIAGNOSIS — J302 Other seasonal allergic rhinitis: Secondary | ICD-10-CM

## 2024-08-15 DIAGNOSIS — E785 Hyperlipidemia, unspecified: Secondary | ICD-10-CM | POA: Diagnosis not present

## 2024-08-15 DIAGNOSIS — H1010 Acute atopic conjunctivitis, unspecified eye: Secondary | ICD-10-CM

## 2024-08-15 DIAGNOSIS — J3089 Other allergic rhinitis: Secondary | ICD-10-CM

## 2024-08-15 LAB — CMP14+EGFR
ALT: 7 IU/L (ref 0–32)
AST: 20 IU/L (ref 0–40)
Albumin: 4.5 g/dL (ref 3.9–4.9)
Alkaline Phosphatase: 37 IU/L — ABNORMAL LOW (ref 44–121)
BUN/Creatinine Ratio: 17 (ref 9–23)
BUN: 13 mg/dL (ref 6–20)
Bilirubin Total: 0.3 mg/dL (ref 0.0–1.2)
CO2: 22 mmol/L (ref 20–29)
Calcium: 9.4 mg/dL (ref 8.7–10.2)
Chloride: 103 mmol/L (ref 96–106)
Creatinine, Ser: 0.78 mg/dL (ref 0.57–1.00)
Globulin, Total: 2.8 g/dL (ref 1.5–4.5)
Glucose: 106 mg/dL — ABNORMAL HIGH (ref 70–99)
Potassium: 4.6 mmol/L (ref 3.5–5.2)
Sodium: 138 mmol/L (ref 134–144)
Total Protein: 7.3 g/dL (ref 6.0–8.5)
eGFR: 104 mL/min/1.73 (ref >59)

## 2024-08-15 LAB — CBC WITH DIFFERENTIAL/PLATELET
Basophils Absolute: 0 x10E3/uL (ref 0.0–0.2)
Basos: 1 %
EOS (ABSOLUTE): 0 x10E3/uL (ref 0.0–0.4)
Eos: 1 %
Hematocrit: 40 % (ref 34.0–46.6)
Hemoglobin: 13 g/dL (ref 11.1–15.9)
Immature Grans (Abs): 0 x10E3/uL (ref 0.0–0.1)
Immature Granulocytes: 0 %
Lymphocytes Absolute: 1.7 x10E3/uL (ref 0.7–3.1)
Lymphs: 41 %
MCH: 31.9 pg (ref 26.6–33.0)
MCHC: 32.5 g/dL (ref 31.5–35.7)
MCV: 98 fL — ABNORMAL HIGH (ref 79–97)
Monocytes Absolute: 0.3 x10E3/uL (ref 0.1–0.9)
Monocytes: 6 %
Neutrophils Absolute: 2.1 x10E3/uL (ref 1.4–7.0)
Neutrophils: 51 %
Platelets: 278 x10E3/uL (ref 150–450)
RBC: 4.07 x10E6/uL (ref 3.77–5.28)
RDW: 11.8 % (ref 11.7–15.4)
WBC: 4.1 x10E3/uL (ref 3.4–10.8)

## 2024-08-15 LAB — LIPID PANEL
Chol/HDL Ratio: 2.2 ratio (ref 0.0–4.4)
Cholesterol, Total: 173 mg/dL (ref 100–199)
HDL: 77 mg/dL (ref >39)
LDL Chol Calc (NIH): 83 mg/dL (ref 0–99)
Triglycerides: 66 mg/dL (ref 0–149)
VLDL Cholesterol Cal: 13 mg/dL (ref 5–40)

## 2024-08-15 LAB — VITAMIN D 25 HYDROXY (VIT D DEFICIENCY, FRACTURES): Vit D, 25-Hydroxy: 23.9 ng/mL — ABNORMAL LOW (ref 30.0–100.0)

## 2024-08-15 LAB — TSH: TSH: 0.904 u[IU]/mL (ref 0.450–4.500)

## 2024-08-15 MED ORDER — VITAMIN D (ERGOCALCIFEROL) 1.25 MG (50000 UNIT) PO CAPS: 50000.0000 [IU] | ORAL_CAPSULE | ORAL | 2 refills | Status: AC

## 2024-08-15 NOTE — Assessment & Plan Note (Signed)
 Persistently low will curbside consult gI, woud need to test for bone specific and GI specific and GIo  from there

## 2024-08-15 NOTE — Assessment & Plan Note (Signed)
 Recent flare with apparent hormonal cjhanges also, dermatology and gyne will manage

## 2024-08-15 NOTE — Assessment & Plan Note (Signed)
 On oral medication only at this time, improved

## 2024-08-15 NOTE — Assessment & Plan Note (Signed)
 Needs once weekly vit D again will commit to 6 months then go from there

## 2024-08-15 NOTE — Patient Instructions (Addendum)
 F/U in 6 months\ Hepatic panel and Vit D level 3 to 5 days before 6 mopnth follow up visit Hep B vaccine series has already been completed  Start weekly vit D  I will curbside consult GI re low alk phos significance and management and get back to you  It is important that you exercise regularly at least 30 minutes 5 times a week. If you develop chest pain, have severe difficulty breathing, or feel very tired, stop exercising immediately and seek medical attention    Thanks for choosing [facility], we consider it a privelige to serve you.

## 2024-08-15 NOTE — Assessment & Plan Note (Signed)
 On weekly immunotherapy and controlled

## 2024-08-15 NOTE — Progress Notes (Signed)
   Kelly [name]     MRN: [id number]      DOB: 1991/04/10  Chief Complaint  Patient presents with   Medical Management of Chronic Issues    Follow up     [name] is here for follow up and re-evaluation of chronic medical conditions, medication management and review of any available recent lab and radiology data.  Preventive health is updated, specifically  Cancer screening and Immunization.   Has noted low energy in past 2 to 3 months Increased and continual menstrual cramps for past 2 months, with flare of acne bumps will see both dermatology and has upcoming gyne eval ROS Denies recent fever or chills. Denies sinus pressure, nasal congestion, ear pain or sore throat. Denies chest congestion, productive cough or wheezing. Denies chest pains, palpitations and leg swelling Denies abdominal pain, nausea, vomiting,diarrhea or constipation.   Denies dysuria, frequency, hesitancy or incontinence. Denies joint pain, swelling and limitation in mobility. Denies headaches, seizures, numbness, or tingling. Denies depression, anxiety or insomnia. Allopecia is doing well  PE  BP 120/80   Pulse 93   Resp 16   Ht 5' 3 (1.6 m)   Wt 117 lb 0.6 oz (53.1 kg)   LMP 07/28/2024 (Exact Date)   SpO2 93%   BMI 20.73 kg/m   Patient alert and oriented and in no cardiopulmonary distress.  HEENT: No facial asymmetry, EOMI,     Neck supple .  Chest: Clear to auscultation bilaterally.  CVS: S1, S2 no murmurs, no S3.Regular rate.  ABD: Soft non tender.   Ext: No edema  MS: Adequate ROM spine, shoulders, hips and knees.  Skin: facial acne  Psych: Good eye contact, normal affect. Memory intact not anxious or depressed appearing.  CNS: CN 2-12 intact, power,  normal throughout.no focal deficits noted.   Assessment & Plan  Acne vulgaris Recent flare with apparent hormonal cjhanges also, dermatology and gyne will manage  Hyperlipidemia LDL goal <100 Corrected with change in  diet  Low vitamin D  level  Needs once weekly vit D again will commit to 6 months then go from there  Traction alopecia On oral medication only at this time, improved  Seasonal and perennial allergic rhinoconjunctivitis On weekly immunotherapy and controlled  Low serum alkaline phosphatase Persistently low will curbside consult gI, woud need to test for bone specific and GI specific and GIo  from there

## 2024-08-15 NOTE — Assessment & Plan Note (Signed)
 Corrected with change in diet

## 2024-08-17 ENCOUNTER — Other Ambulatory Visit: Payer: Self-pay

## 2024-08-17 DIAGNOSIS — R748 Abnormal levels of other serum enzymes: Secondary | ICD-10-CM

## 2024-08-17 DIAGNOSIS — R7989 Other specified abnormal findings of blood chemistry: Secondary | ICD-10-CM

## 2024-08-21 ENCOUNTER — Encounter: Payer: Self-pay | Admitting: Family Medicine

## 2024-08-21 DIAGNOSIS — R7989 Other specified abnormal findings of blood chemistry: Secondary | ICD-10-CM

## 2024-08-21 DIAGNOSIS — R748 Abnormal levels of other serum enzymes: Secondary | ICD-10-CM

## 2024-08-23 NOTE — Progress Notes (Unsigned)
 Follow Up Note  RE: Kelly [name] MRN: [id number] DOB: Sep 14, 1991 Date of Office Visit: 08/24/2024  Referring provider: Antonetta Rollene BRAVO, MD Primary care provider: Antonetta Rollene BRAVO, MD  Chief Complaint: No chief complaint on file.  History of Present Illness: I had the pleasure of seeing Kelly [name] for a follow up visit at the [facility] on 08/24/2024. She is a 33 y.o. female, who is being followed for allergic rhinoconjunctivitis on AIT. Her previous allergy office visit was on 06/22/2023 with Dr. Luke. Today is a regular follow up visit.  Discussed the use of AI scribe software for clinical note transcription with the patient, who gave verbal consent to proceed.  History of Present Illness            ***  Assessment and Plan: Kelly [name] is a 33 y.o. female with: Seasonal and perennial allergic rhinoconjunctivitis Past history - Rhinoconjunctivitis symptoms for 20+ years mainly in the spring and fall.  Zyrtec caused nosebleeds. 2022 skin testing showed: Positive to grass pollen, weed, ragweed, trees, mold. Started AIT on 08/28/2021 (G-Rw-W-T & M). Interim history - some localized reactions with AIT. Symptoms improved. Continue environmental control measures.  Continue allergy injections - given today.  Double up on the antihistamines the day before, the day of and the day after injection. You can take Zyrtec, Claritin, Allegra or Xyzal.  May use topical hydrocortisone cream as needed on the rash.  For mild symptoms you can take over the counter antihistamines such as Benadryl and monitor symptoms closely. If symptoms worsen or if you have severe symptoms including breathing issues, throat closure, significant swelling, whole body hives, severe diarrhea and vomiting, lightheadedness then inject epinephrine  and seek immediate medical care afterwards. Use Flonase  (fluticasone ) nasal spray 1 spray per nostril twice a day as needed for nasal congestion.   Nasal saline spray (i.e., Simply Saline) or nasal saline lavage (i.e., NeilMed) is recommended as needed and prior to medicated nasal sprays. Continue Singulair  (montelukast ) 10mg  daily at night. You may try to stop in the fall/winter months.  May use olopatadine  eye drops 0.2% once a day as needed for itchy/watery eyes.   Assessment and Plan              No follow-ups on file.  No orders of the defined types were placed in this encounter.  Lab Orders  No laboratory test(s) ordered today    Diagnostics: Spirometry:  Tracings reviewed. Her effort: {Blank single:19197::Good reproducible efforts.,It was hard to get consistent efforts and there is a question as to whether this reflects a maximal maneuver.,Poor effort, data can not be interpreted.} FVC: ***L FEV1: ***L, ***% predicted FEV1/FVC ratio: ***% Interpretation: {Blank single:19197::Spirometry consistent with mild obstructive disease,Spirometry consistent with moderate obstructive disease,Spirometry consistent with severe obstructive disease,Spirometry consistent with possible restrictive disease,Spirometry consistent with mixed obstructive and restrictive disease,Spirometry uninterpretable due to technique,Spirometry consistent with normal pattern,No overt abnormalities noted given today's efforts}.  Please see scanned spirometry results for details.  Skin Testing: {Blank single:19197::Select foods,Environmental allergy panel,Environmental allergy panel and select foods,Food allergy panel,None,Deferred due to recent antihistamines use}. *** Results discussed with patient/family.   Medication List:  Current Outpatient Medications  Medication Sig Dispense Refill  . doxycycline (VIBRA-TABS) 100 MG tablet Take 1 tablet (100 mg total) by mouth 2 (two) times daily.    . EPINEPHrine  0.3 mg/0.3 mL IJ SOAJ injection Inject 0.3 mg into the muscle as needed for anaphylaxis. 1 each 2  .  minoxidil  (LONITEN) 2.5 MG tablet Takes half daily 30 tablet 2  . spironolactone (ALDACTONE) 50 MG tablet SMARTSIG:1 Pill By Mouth Every Morning    . tretinoin  (RETIN-A ) 0.025 % cream Apply topically at bedtime. 45 g 0  . Vitamin D , Ergocalciferol , (DRISDOL ) 1.25 MG (50000 UNIT) CAPS capsule Take 1 capsule (50,000 Units total) by mouth every 7 (seven) days. 12 capsule 2   No current facility-administered medications for this visit.   Allergies: Allergies  Allergen Reactions  . Penicillins Rash   I reviewed her past medical history, social history, family history, and environmental history and no significant changes have been reported from her previous visit.  Review of Systems  Constitutional:  Negative for appetite change, chills, fever and unexpected weight change.  HENT:  Negative for congestion, rhinorrhea and sneezing.   Eyes:  Negative for itching.  Respiratory:  Negative for cough, chest tightness, shortness of breath and wheezing.   Cardiovascular:  Negative for chest pain.  Gastrointestinal:  Negative for abdominal pain.  Genitourinary:  Negative for difficulty urinating.  Skin:  Negative for rash.  Allergic/Immunologic: Positive for environmental allergies.  Neurological:  Negative for headaches.    Objective: LMP 07/28/2024 (Exact Date)  There is no height or weight on file to calculate BMI. Physical Exam Vitals and nursing note reviewed.  Constitutional:      Appearance: Normal appearance. She is well-developed.  HENT:     Head: Normocephalic and atraumatic.     Right Ear: Tympanic membrane and external ear normal.     Left Ear: Tympanic membrane and external ear normal.     Nose: Nose normal.     Mouth/Throat:     Mouth: Mucous membranes are moist.     Pharynx: Oropharynx is clear.  Eyes:     Conjunctiva/sclera: Conjunctivae normal.  Cardiovascular:     Rate and Rhythm: Normal rate and regular rhythm.     Heart sounds: Normal heart sounds. No murmur heard.    No  friction rub. No gallop.  Pulmonary:     Effort: Pulmonary effort is normal.     Breath sounds: Normal breath sounds. No wheezing, rhonchi or rales.  Musculoskeletal:     Cervical back: Neck supple.  Skin:    General: Skin is warm.     Findings: No rash.  Neurological:     Mental Status: She is alert and oriented to person, place, and time.  Psychiatric:        Behavior: Behavior normal.   Previous notes and tests were reviewed. The plan was reviewed with the patient/family, and all questions/concerned were addressed.  It was my pleasure to see Kelly [name] today and participate in her care. Please feel free to contact me with any questions or concerns.  Sincerely,  Orlan Cramp, DO Allergy & Immunology  [facility]  [place] office: [phone] [facility] office: [phone]

## 2024-08-24 ENCOUNTER — Ambulatory Visit: Admitting: Allergy

## 2024-08-24 ENCOUNTER — Encounter: Payer: Self-pay | Admitting: Allergy

## 2024-08-24 ENCOUNTER — Other Ambulatory Visit: Payer: Self-pay

## 2024-08-24 VITALS — BP 116/68 | HR 71 | Temp 98.1°F | Resp 18 | Wt 117.8 lb

## 2024-08-24 DIAGNOSIS — H1010 Acute atopic conjunctivitis, unspecified eye: Secondary | ICD-10-CM

## 2024-08-24 DIAGNOSIS — J302 Other seasonal allergic rhinitis: Secondary | ICD-10-CM

## 2024-08-24 DIAGNOSIS — J3089 Other allergic rhinitis: Secondary | ICD-10-CM | POA: Diagnosis not present

## 2024-08-24 MED ORDER — NEFFY 2 MG/0.1ML NA SOLN: 1.0000 | NASAL | 1 refills | Status: DC | PRN

## 2024-08-24 NOTE — Patient Instructions (Addendum)
 Environmental allergies 2022 skin testing positive to grass pollen, weed, ragweed, trees, mold. Continue environmental control measures.  Continue allergy injections.  Double up on the antihistamines the day before, the day of and the day after injection. You can take Zyrtec, Claritin, Allegra or Xyzal.  May use topical hydrocortisone cream as needed on the rash.  I have prescribed epinephrine  device (Neffy ) and demonstrated proper use. For mild symptoms you can take over the counter antihistamines such as zyrtec 10mg  to 20mg  and monitor symptoms closely. If symptoms worsen or if you have severe symptoms including breathing issues, throat closure, significant swelling, whole body hives, severe diarrhea and vomiting, lightheadedness then spray Neffy  in the nose and seek immediate medical care afterwards. Do not use any nasal sprays for 2 weeks afterwards.  If Neffy  is not covered let me know.  Emergency action plan provided.    Use Flonase  (fluticasone ) nasal spray 1-2 sprays per nostril once a day as needed for nasal congestion.  Nasal saline spray (i.e., Simply Saline) or nasal saline lavage (i.e., NeilMed) is recommended as needed and prior to medicated nasal sprays. May use olopatadine  eye drops 0.2% once a day as needed for itchy/watery eyes.  Follow up in 12 months or sooner if needed.

## 2024-08-25 ENCOUNTER — Telehealth: Payer: Self-pay

## 2024-08-25 NOTE — Telephone Encounter (Signed)
 Copied from CRM [id number]. Topic: Clinical - Medication Question >> Aug 25, 2024  2:26 PM Kelly [name] wrote: Reason for CRM: Vitamin D , Ergocalciferol , (DRISDOL ) 1.25 MG (50000 UNIT) CAPS capsule She should only be taking it every 7 days However, she has been taking it daily instead by accident She wants to know if anything bad can happen or the next steps

## 2024-08-25 NOTE — Telephone Encounter (Signed)
 Message sent

## 2024-09-04 ENCOUNTER — Encounter: Payer: Self-pay | Admitting: Family Medicine

## 2024-09-04 ENCOUNTER — Ambulatory Visit: Payer: Self-pay

## 2024-09-04 NOTE — Telephone Encounter (Signed)
 FYI Only or Action Required?: FYI only for provider.  Patient was last seen in primary care on 08/15/2024 by Antonetta Rollene BRAVO, MD.  Called Nurse Triage reporting Vaginal Bleeding.  Symptoms began today.  Interventions attempted: Nothing.  Symptoms are: gradually improving.  Triage Disposition: Patient/caregiver understands and will follow disposition?: Unsure First advised ED because pt was still dizzy then pt clarified that she is not dizzy. No openings in office advised pt to go to UC. Unsure she will go       Copied from CRM [id number]. Topic: Clinical - Red Word Triage >> Sep 04, 2024  2:22 PM Selinda RAMAN wrote: Red Word that prompted transfer to Nurse Triage: The patient called in stating even though her Dermatologist prescribed her Spirolactone 100MG  she wanted to call her primary because she has been having bad reactions to the medication. She says she has had bad dizziness, has blacked out twice and has been passing really bad clots with her cycle. I will transfer her to E2C2 NT. Reason for Disposition  Bleeding (e.g., vomiting blood, rectal bleeding or tarry stools, severe vaginal bleeding)  (Exception: Fainted from sight of small amount of blood; small cut or abrasion.)  [1] All other patients AND [2] now alert and feels fine  (Exception: SIMPLE FAINT due to stress, pain, prolonged standing, or suddenly standing)  Protocols used: Fainting-A-AH

## 2024-09-04 NOTE — Telephone Encounter (Signed)
 Noted, patient advised to go to [facility]

## 2024-09-05 ENCOUNTER — Other Ambulatory Visit: Payer: Self-pay

## 2024-09-07 ENCOUNTER — Ambulatory Visit (INDEPENDENT_AMBULATORY_CARE_PROVIDER_SITE_OTHER)

## 2024-09-07 DIAGNOSIS — J309 Allergic rhinitis, unspecified: Secondary | ICD-10-CM | POA: Diagnosis not present

## 2024-09-23 ENCOUNTER — Encounter: Payer: Self-pay | Admitting: Family Medicine

## 2024-09-24 ENCOUNTER — Telehealth: Admitting: Physician Assistant

## 2024-09-24 ENCOUNTER — Encounter

## 2024-09-24 ENCOUNTER — Telehealth

## 2024-09-24 DIAGNOSIS — R42 Dizziness and giddiness: Secondary | ICD-10-CM

## 2024-09-24 MED ORDER — MECLIZINE HCL 25 MG PO TABS: 25.0000 mg | ORAL_TABLET | Freq: Three times a day (TID) | ORAL | 0 refills | Status: AC | PRN

## 2024-09-24 NOTE — Progress Notes (Signed)
 E-Visit for Motion Sickness  We are sorry that you are not feeling well. Here is how we plan to help!  Based on what you have shared with me it looks like you have symptoms of motion sickness.  I have prescribed a medication that will help prevent or alleviate your symptoms:  Meclizine  25mg  by mouth three times per day as needed for nausea/motion sickness   Prevention:  You might feel better if you keep your eyes focused on outside while you are in motion. For example, if you are in a car, sit in the front and look in the direction you are moving; if you are on a boat, stay on the deck and look to the horizon. This helps make what you see match the movement you are feeling, and so you are less likely to feel sick.  You should also avoid reading, watching a movie, texting or reading messages, or looking at things close to you inside the vehicle you are riding in.  Use the seat head rest. Lean your head against the back of the seat or head rest when traveling in vehicles with seats to minimize head movements.  On a ship: When making your reservations, choose a cabin in the middle of the ship and near the waterline. When on board, go up on deck and focus on the horizon.  In an airplane: Request a window seat and look out the window. A seat over the front edge of the wing is the most preferable spot (the degree of motion is the lowest here). Direct the air vent to blow cool air on your face.  On a train: Always face forward and sit near a window.  In a vehicle: Sit in the front seat; if you are the passenger, look at the scenery in the distance. For some people, driving the vehicle (rather than being a passenger) is an instant remedy.  Avoid others who have become nauseous with motion sickness. Seeing and smelling others who have motion sickness may cause you to become sick.  GET HELP RIGHT AWAY IF:  Your symptoms do not improve or worsen within 2 days after treatment.  You cannot keep  down fluids after trying the medication.  Other associated symptoms such as severe headache, visual field changes, fever, or intractable nausea and vomiting.  MAKE SURE YOU:  Understand these instructions. Will watch your condition. Will get help right away if you are not doing well or get worse.  Thank you for choosing an e-visit.  Your e-visit answers were reviewed by a board certified advanced clinical practitioner to complete your personal care plan. Depending upon the condition, your plan could have included both over the counter or prescription medications.  Please review your pharmacy choice. Be sure that the pharmacy you have chosen is open so that you can pick up your prescription now.  If there is a problem you may message your provider in MyChart to have the prescription routed to another pharmacy.  Your safety is important to us . If you have drug allergies check your prescription carefully.   For the next 24 hours, you can use MyChart to ask questions about today's visit, request a non-urgent call back, or ask for a work or school excuse from your e-visit provider.  You will get an e-mail in the next two days asking about your experience. I hope that your e-visit has been valuable and will speed your recovery.   References or for more information: https://cross.com/ https://my.https://rowe.info/ https://www.uptodate.com  I have spent 5 minutes in review of e-visit questionnaire, review and updating patient chart, medical decision making and response to patient.   Harlene PEDLAR Ward, PA-C

## 2024-09-27 ENCOUNTER — Ambulatory Visit

## 2024-09-27 VITALS — BP 119/82 | HR 80 | Ht 62.0 in | Wt 115.0 lb

## 2024-09-27 DIAGNOSIS — N760 Acute vaginitis: Secondary | ICD-10-CM

## 2024-09-27 DIAGNOSIS — L700 Acne vulgaris: Secondary | ICD-10-CM

## 2024-09-27 DIAGNOSIS — R42 Dizziness and giddiness: Secondary | ICD-10-CM

## 2024-09-27 MED ORDER — FLUCONAZOLE 150 MG PO TABS: 150.0000 mg | ORAL_TABLET | Freq: Once | ORAL | 0 refills | Status: DC

## 2024-09-27 NOTE — Progress Notes (Signed)
 Established Patient Office Visit  Subjective   Patient ID: Kelly [name], female    DOB: 29-Jun-1991  Age: 33 y.o. MRN: [id number]  Chief Complaint  Patient presents with   Hospitalization Follow-up    ER follow up    HPI  Pt went to [facility] on 09/22/24 for the following.  Patient reports feeling dizzy while she was out shopping with her sister. Patient reports a similar episode two weeks ago.   She is a 33 year old female coming to the emergency department complaining of lightheadness that occurred earlier today states that she was shopping and was standing up when she felt lightheaded. Family member states that patient appeared to be pale however patient did not lose consciousness. States that patient had a similar episode 2 weeks ago after her spironolactone was increased in which patient felt lightheaded and then blacked out. Patient states that she was put back on her old dose and states that she is proficient in keeping herself hydrated with fluids.    Patient Active Problem List   Diagnosis Date Noted   Low serum alkaline phosphatase 08/15/2024   Seasonal and perennial allergic rhinoconjunctivitis 08/07/2021   Left ear hearing loss 08/05/2021   Hyperlipidemia LDL goal <100 06/26/2021   Low vitamin D  level 06/26/2021   BPV (benign positional vertigo), bilateral 05/27/2021   History of superficial phlebitis 04/15/2021   Skin inflammation 07/09/2020   Traction alopecia 01/21/2019   Acne vulgaris 04/20/2017    ROS    Objective:     BP 119/82   Pulse 80   Ht 5' 2 (1.575 m)   Wt 115 lb (52.2 kg)   SpO2 98%   BMI 21.03 kg/m  BP Readings from Last 3 Encounters:  09/27/24 119/82  08/24/24 116/68  08/15/24 120/80   Wt Readings from Last 3 Encounters:  09/27/24 115 lb (52.2 kg)  08/24/24 117 lb 12.8 oz (53.4 kg)  08/15/24 117 lb 0.6 oz (53.1 kg)     Physical Exam Vitals and nursing note reviewed.  Constitutional:      Appearance: Normal appearance.   HENT:     Head: Normocephalic.     Right Ear: Tympanic membrane, ear canal and external ear normal.     Left Ear: Tympanic membrane, ear canal and external ear normal.     Nose: Nose normal.     Mouth/Throat:     Mouth: Mucous membranes are moist.     Pharynx: Oropharynx is clear.  Cardiovascular:     Rate and Rhythm: Normal rate and regular rhythm.  Pulmonary:     Effort: Pulmonary effort is normal.     Breath sounds: Normal breath sounds.  Musculoskeletal:     Cervical back: Normal range of motion and neck supple.  Skin:    General: Skin is warm and dry.  Neurological:     Mental Status: She is alert and oriented to person, place, and time.  Psychiatric:        Mood and Affect: Mood normal.        Thought Content: Thought content normal.     No results found for any visits on 09/27/24.    The ASCVD Risk score (Arnett DK, et al., 2019) failed to calculate for the following reasons:   The 2019 ASCVD risk score is only valid for ages 75 to 75    Assessment & Plan:   Problem List Items Addressed This Visit   None Visit Diagnoses  Acute vaginitis    -  Primary     Episodic lightheadedness         Assessment and Plan     Lightheadedness and unsteadiness   Acne  Possible vulvovaginal candidiasis        No follow-ups on file.    Leita Longs, FNP

## 2024-10-02 ENCOUNTER — Ambulatory Visit: Admitting: Adult Health

## 2024-10-02 NOTE — Assessment & Plan Note (Signed)
 Managed with doxycycline and spironolactone. Dosage adjusted due to side effects. - Continue current acne treatment regimen with adjusted spironolactone dosage.

## 2024-10-02 NOTE — Assessment & Plan Note (Signed)
 Likely hypotension from spironolactone. Symptoms began after dosage increase. Blood pressure 90/60 mmHg. Symptoms inconsistent with vertigo. - Reduce spironolactone dosage by halving the current 50 mg pill. - Discontinue meclizine  unless nausea occurs. - Add electrolytes to water intake.

## 2024-10-05 ENCOUNTER — Ambulatory Visit (INDEPENDENT_AMBULATORY_CARE_PROVIDER_SITE_OTHER)

## 2024-10-05 DIAGNOSIS — J309 Allergic rhinitis, unspecified: Secondary | ICD-10-CM | POA: Diagnosis not present

## 2024-10-24 ENCOUNTER — Ambulatory Visit: Admitting: Women's Health

## 2024-10-25 ENCOUNTER — Other Ambulatory Visit: Payer: Self-pay

## 2024-10-25 DIAGNOSIS — N760 Acute vaginitis: Secondary | ICD-10-CM

## 2024-10-26 ENCOUNTER — Telehealth: Admitting: Physician Assistant

## 2024-10-26 DIAGNOSIS — J069 Acute upper respiratory infection, unspecified: Secondary | ICD-10-CM

## 2024-10-26 MED ORDER — IPRATROPIUM BROMIDE 0.03 % NA SOLN
2.0000 | Freq: Two times a day (BID) | NASAL | 0 refills | Status: AC
Start: 2024-10-26 — End: ?

## 2024-10-26 NOTE — Progress Notes (Signed)
 Virtual Visit Consent   Kelly [name], you are scheduled for a virtual visit with a [facility] provider today. Just as with appointments in the office, your consent must be obtained to participate. Your consent will be active for this visit and any virtual visit you may have with one of our providers in the next 365 days. If you have a MyChart account, a copy of this consent can be sent to you electronically.  As this is a virtual visit, video technology does not allow for your provider to perform a traditional examination. This may limit your provider's ability to fully assess your condition. If your provider identifies any concerns that need to be evaluated in person or the need to arrange testing (such as labs, EKG, etc.), we will make arrangements to do so. Although advances in technology are sophisticated, we cannot ensure that it will always work on either your end or our end. If the connection with a video visit is poor, the visit may have to be switched to a telephone visit. With either a video or telephone visit, we are not always able to ensure that we have a secure connection.  By engaging in this virtual visit, you consent to the provision of healthcare and authorize for your insurance to be billed (if applicable) for the services provided during this visit. Depending on your insurance coverage, you may receive a charge related to this service.  I need to obtain your verbal consent now. Are you willing to proceed with your visit today? Kelly [name] has provided verbal consent on 10/26/2024 for a virtual visit (video or telephone). Kelly [name], [place]  Date: 10/26/2024 3:11 PM   Virtual Visit via Video Note   I, Kelly [name], connected with  Kelly [name]  ([id number], 12/29/1990) on 10/26/24 at  3:00 PM EDT by a video-enabled telemedicine application and verified that I am speaking with the correct person using two identifiers.  Location: Patient: Virtual Visit  Location Patient: Home Provider: Virtual Visit Location Provider: Home Office   I discussed the limitations of evaluation and management by telemedicine and the availability of in person appointments. The patient expressed understanding and agreed to proceed.    History of Present Illness: Kelly [name] is a 33 y.o. who identifies as a female who was assigned female at birth, and is being seen today for 3.5 days of scratchy throat that worsened on Tuesday but has resolved since then, nasal congestion, sinus pressure and headache. Denies fever, chills, aches. Denies chest congestion or cough. Denies known sick contact or any recent travel.  OTC -- Mucinex  HPI: HPI  Problems:  Patient Active Problem List   Diagnosis Date Noted   Low serum alkaline phosphatase 08/15/2024   Seasonal and perennial allergic rhinoconjunctivitis 08/07/2021   Left ear hearing loss 08/05/2021   Hyperlipidemia LDL goal <100 06/26/2021   Low vitamin D  level 06/26/2021   BPV (benign positional vertigo), bilateral 05/27/2021   Episodic lightheadedness 05/22/2021   History of superficial phlebitis 04/15/2021   Skin inflammation 07/09/2020   Traction alopecia 01/21/2019   Acne vulgaris 04/20/2017    Allergies:  Allergies  Allergen Reactions   Penicillins Rash   Medications:  Current Outpatient Medications:    doxycycline (VIBRA-TABS) 100 MG tablet, Take 1 tablet (100 mg total) by mouth 2 (two) times daily., Disp: , Rfl:    EPINEPHrine  (NEFFY ) 2 MG/0.1ML SOLN, Place 1 Dose into the nose as needed (anaphylactic reaction)., Disp: 2 each,  Rfl: 1   EPINEPHrine  0.3 mg/0.3 mL IJ SOAJ injection, Inject 0.3 mg into the muscle as needed for anaphylaxis., Disp: 1 each, Rfl: 2   fluconazole  (DIFLUCAN ) 150 MG tablet, Take 1 tablet (150 mg total) by mouth once for 1 dose. May repeat dose in one week if needed., Disp: 2 tablet, Rfl: 0   meclizine  (ANTIVERT ) 25 MG tablet, Take 1 tablet (25 mg total) by mouth 3 (three) times  daily as needed for dizziness., Disp: 30 tablet, Rfl: 0   minoxidil (LONITEN) 2.5 MG tablet, Takes half daily, Disp: 30 tablet, Rfl: 2   spironolactone (ALDACTONE) 50 MG tablet, SMARTSIG:1 Pill By Mouth Every Morning, Disp: , Rfl:    tretinoin  (RETIN-A ) 0.025 % cream, Apply topically at bedtime., Disp: 45 g, Rfl: 0   Vitamin D , Ergocalciferol , (DRISDOL ) 1.25 MG (50000 UNIT) CAPS capsule, Take 1 capsule (50,000 Units total) by mouth every 7 (seven) days., Disp: 12 capsule, Rfl: 2  Observations/Objective: Patient is well-developed, well-nourished in no acute distress.  Resting comfortably at home.  Head is normocephalic, atraumatic.  No labored breathing.  Speech is clear and coherent with logical content.  Patient is alert and oriented at baseline.   Assessment and Plan: 1. Viral URI (Primary)  Supportive measures and OTC medications review. No concerns for pregnancy. She is to stop Mucinex and Start Tylenol Sinus. Atrovent nasal spray per orders. Monitor symptoms for resolution. Follow-up precautions discussed.  Follow Up Instructions: I discussed the assessment and treatment plan with the patient. The patient was provided an opportunity to ask questions and all were answered. The patient agreed with the plan and demonstrated an understanding of the instructions.  A copy of instructions were sent to the patient via MyChart unless otherwise noted below.   The patient was advised to call back or seek an in-person evaluation if the symptoms worsen or if the condition fails to improve as anticipated.    Kelly Velma Lunger, PA-C

## 2024-10-26 NOTE — Patient Instructions (Signed)
 Kelly [name], thank you for joining Kelly Velma Lunger, PA-C for today's virtual visit.  While this provider is not your primary care provider (PCP), if your PCP is located in our provider database this encounter information will be shared with them immediately following your visit.   A [facility] MyChart account gives you access to today's visit and all your visits, tests, and labs performed at [facility]  click here if you don't have a [facility] MyChart account or go to [email or web address]  Consent: (Patient) Kelly [name] provided verbal consent for this virtual visit at the beginning of the encounter.  Current Medications:  Current Outpatient Medications:    ipratropium (ATROVENT) 0.03 % nasal spray, Place 2 sprays into both nostrils every 12 (twelve) hours., Disp: 30 mL, Rfl: 0   doxycycline (VIBRA-TABS) 100 MG tablet, Take 1 tablet (100 mg total) by mouth 2 (two) times daily., Disp: , Rfl:    EPINEPHrine  (NEFFY ) 2 MG/0.1ML SOLN, Place 1 Dose into the nose as needed (anaphylactic reaction)., Disp: 2 each, Rfl: 1   EPINEPHrine  0.3 mg/0.3 mL IJ SOAJ injection, Inject 0.3 mg into the muscle as needed for anaphylaxis., Disp: 1 each, Rfl: 2   fluconazole  (DIFLUCAN ) 150 MG tablet, Take 1 tablet (150 mg total) by mouth once for 1 dose. May repeat dose in one week if needed., Disp: 2 tablet, Rfl: 0   meclizine  (ANTIVERT ) 25 MG tablet, Take 1 tablet (25 mg total) by mouth 3 (three) times daily as needed for dizziness., Disp: 30 tablet, Rfl: 0   minoxidil (LONITEN) 2.5 MG tablet, Takes half daily, Disp: 30 tablet, Rfl: 2   spironolactone (ALDACTONE) 50 MG tablet, SMARTSIG:1 Pill By Mouth Every Morning, Disp: , Rfl:    tretinoin  (RETIN-A ) 0.025 % cream, Apply topically at bedtime., Disp: 45 g, Rfl: 0   Vitamin D , Ergocalciferol , (DRISDOL ) 1.25 MG (50000 UNIT) CAPS capsule, Take 1 capsule (50,000 Units total) by mouth every 7 (seven) days., Disp: 12 capsule, Rfl: 2    Medications ordered in this encounter:  Meds ordered this encounter  Medications   ipratropium (ATROVENT) 0.03 % nasal spray    Sig: Place 2 sprays into both nostrils every 12 (twelve) hours.    Dispense:  30 mL    Refill:  0    Supervising Provider:   BLAISE ALEENE KIDD [[id number]]     *If you need refills on other medications prior to your next appointment, please contact your pharmacy*  Follow-Up: Call back or seek an in-person evaluation if the symptoms worsen or if the condition fails to improve as anticipated.  [facility] [phone]  Other Instructions Please hydrate and rest. Start the Atrovent nasal spray as directed. Use OTC Tylenol Sinus. Put a humidifier in the bedroom and run at night. If you note any non-resolving, new, or worsening symptoms despite treatment, please seek an in-person evaluation ASAP.    If you have been instructed to have an in-person evaluation today at a local Urgent Care facility, please use the link below. It will take you to a list of all of our available [facility], including address, phone number and hours of operation. Please do not delay care.  [facility]  If you or a family member do not have a primary care provider, use the link below to schedule a visit and establish care. When you choose a [facility] primary care physician or advanced practice provider, you gain a long-term partner  in health. Find a Primary Care Provider  Learn more about [facility]'s in-office and virtual care options: [facility] - Get Care Now

## 2024-10-29 ENCOUNTER — Encounter: Payer: Self-pay | Admitting: Family Medicine

## 2024-11-09 ENCOUNTER — Ambulatory Visit (INDEPENDENT_AMBULATORY_CARE_PROVIDER_SITE_OTHER)

## 2024-11-09 DIAGNOSIS — J309 Allergic rhinitis, unspecified: Secondary | ICD-10-CM | POA: Diagnosis not present

## 2024-11-10 ENCOUNTER — Other Ambulatory Visit: Payer: Self-pay | Admitting: Family Medicine

## 2024-11-10 DIAGNOSIS — E785 Hyperlipidemia, unspecified: Secondary | ICD-10-CM

## 2024-11-10 DIAGNOSIS — R7989 Other specified abnormal findings of blood chemistry: Secondary | ICD-10-CM

## 2024-12-14 ENCOUNTER — Ambulatory Visit: Admitting: Women's Health

## 2024-12-14 ENCOUNTER — Ambulatory Visit

## 2024-12-14 DIAGNOSIS — J309 Allergic rhinitis, unspecified: Secondary | ICD-10-CM

## 2024-12-25 DIAGNOSIS — J301 Allergic rhinitis due to pollen: Secondary | ICD-10-CM | POA: Diagnosis not present

## 2024-12-25 NOTE — Progress Notes (Signed)
 VIALS MADE ON 12/25/24

## 2024-12-26 ENCOUNTER — Other Ambulatory Visit: Payer: Self-pay

## 2024-12-26 DIAGNOSIS — J302 Other seasonal allergic rhinitis: Secondary | ICD-10-CM | POA: Diagnosis not present

## 2024-12-26 MED ORDER — NEFFY 2 MG/0.1ML NA SOLN: 1.0000 | NASAL | 1 refills | Status: DC | PRN

## 2025-01-18 ENCOUNTER — Other Ambulatory Visit (HOSPITAL_COMMUNITY)
Admission: RE | Admit: 2025-01-18 | Discharge: 2025-01-18 | Disposition: A | Source: Ambulatory Visit | Attending: Obstetrics & Gynecology | Admitting: Obstetrics & Gynecology

## 2025-01-18 ENCOUNTER — Ambulatory Visit

## 2025-01-18 ENCOUNTER — Encounter: Payer: Self-pay | Admitting: Women's Health

## 2025-01-18 ENCOUNTER — Ambulatory Visit: Admitting: Women's Health

## 2025-01-18 VITALS — BP 116/75 | HR 81 | Ht 62.0 in | Wt 122.0 lb

## 2025-01-18 DIAGNOSIS — Z124 Encounter for screening for malignant neoplasm of cervix: Secondary | ICD-10-CM | POA: Diagnosis present

## 2025-01-18 DIAGNOSIS — Z1151 Encounter for screening for human papillomavirus (HPV): Secondary | ICD-10-CM | POA: Diagnosis not present

## 2025-01-18 DIAGNOSIS — Z1331 Encounter for screening for depression: Secondary | ICD-10-CM

## 2025-01-18 DIAGNOSIS — N6325 Unspecified lump in the left breast, overlapping quadrants: Secondary | ICD-10-CM | POA: Diagnosis not present

## 2025-01-18 DIAGNOSIS — N6313 Unspecified lump in the right breast, lower outer quadrant: Secondary | ICD-10-CM | POA: Diagnosis not present

## 2025-01-18 DIAGNOSIS — Z01411 Encounter for gynecological examination (general) (routine) with abnormal findings: Secondary | ICD-10-CM

## 2025-01-18 DIAGNOSIS — J302 Other seasonal allergic rhinitis: Secondary | ICD-10-CM | POA: Diagnosis not present

## 2025-01-18 DIAGNOSIS — Z01419 Encounter for gynecological examination (general) (routine) without abnormal findings: Secondary | ICD-10-CM

## 2025-01-18 DIAGNOSIS — N63 Unspecified lump in unspecified breast: Secondary | ICD-10-CM

## 2025-01-18 NOTE — Progress Notes (Signed)
 "  WELL-WOMAN EXAMINATION Patient name: Kelly [name] MRN [id number]  Date of birth: [birth year] Chief Complaint:   Annual Exam  History of Present Illness:   Kelly [name] is a 34 y.o. G0P0000 African-American female being seen today for a routine well-woman exam.  Current complaints: none  PCP: [facility]      does not desire labs Patient's last menstrual period was 01/09/2025 (exact date). The current method of family planning is condoms.  Last pap 04/15/21. Results were: NILM w/ HRHPV negative. H/O abnormal pap: no Last mammogram: never, did have breast u/s in 2018. Results were: normal. Family h/o breast cancer: no Last colonoscopy: never. Results were: N/A. Family h/o colorectal cancer: no     01/18/2025    8:43 AM 09/27/2024    8:07 AM 08/15/2024    4:25 PM 02/15/2024    4:34 PM 08/13/2023    4:40 PM  Depression screen PHQ 2/9  Decreased Interest 0 0 0 0 0  Down, Depressed, Hopeless 0 0 0 0 0  PHQ - 2 Score 0 0 0 0 0  Altered sleeping 0 0     Tired, decreased energy 0 1     Change in appetite 0 0     Feeling bad or failure about yourself  0 0     Trouble concentrating 0 0     Moving slowly or fidgety/restless 0 0     Suicidal thoughts 0 0     PHQ-9 Score 0 1      Difficult doing work/chores  Not difficult at all        Data saved with a previous flowsheet row definition        01/18/2025    8:43 AM 09/27/2024    8:07 AM 08/15/2024    4:25 PM 08/13/2023    4:39 PM  GAD 7 : Generalized Anxiety Score  Nervous, Anxious, on Edge 0 0  0  0   Control/stop worrying 0 0  0  0   Worry too much - different things 0 0  0  0   Trouble relaxing 0 0  0  0   Restless 0 0  0  0   Easily annoyed or irritable 0 0  0  0   Afraid - awful might happen 0 0  0  0   Total GAD 7 Score 0 0 0 0  Anxiety Difficulty  Not difficult at all Not difficult at all      Data saved with a previous flowsheet row definition     Review of Systems:   Pertinent items are noted in HPI Denies any  headaches, blurred vision, fatigue, shortness of breath, chest pain, abdominal pain, abnormal vaginal discharge/itching/odor/irritation, problems with periods, bowel movements, urination, or intercourse unless otherwise stated above. Pertinent History Reviewed:  Reviewed past medical,surgical, social and family history.  Reviewed problem list, medications and allergies. Physical Assessment:   Vitals:   01/18/25 0832  BP: 116/75  Pulse: 81  Weight: 122 lb (55.3 kg)  Height: 5' 2 (1.575 m)  Body mass index is 22.31 kg/m.        Physical Examination:   General appearance - well appearing, and in no distress  Mental status - alert, oriented to person, place, and time  Psych:  She has a normal mood and affect  Skin - warm and dry, normal color, no suspicious lesions noted  Chest - effort normal, all lung fields clear to auscultation bilaterally  Heart -  normal rate and regular rhythm  Neck:  midline trachea, no thyromegaly or nodules  Breasts - breasts appear normal, no skin or nipple changes or axillary nodes, RT ~2cm round firm mobile mass 8 o'clock ~2-73fb from nipple, Lt ~4cm elongated/oval firm mass 3 o'clock 58fb from nipple  Abdomen - soft, nontender, nondistended, no masses or organomegaly  Pelvic - VULVA: normal appearing vulva with no masses, tenderness or lesions  VAGINA: normal appearing vagina with normal color and discharge, no lesions  CERVIX: normal appearing cervix without discharge or lesions, no CMT  Thin prep pap is done w/ HR HPV cotesting  UTERUS: uterus is felt to be normal size, shape, consistency and nontender   ADNEXA: No adnexal masses or tenderness noted.  Extremities:  No swelling or varicosities noted  Chaperone: Aleck Blase  No results found for this or any previous visit (from the past 24 hours).  Assessment & Plan:  1) Well-Woman Exam  2) Bilateral breast masses> orders placed for diagnostic u/s and mammo at [facility] (lives in [place]), note  routed to [place] to schedule  Labs/procedures today: pap  Mammogram: diagnostic mammo and breast u/s ordered, or sooner if problems Colonoscopy: @ 32yo, or sooner if problems  Orders Placed This Encounter  Procedures   US  LIMITED ULTRASOUND INCLUDING AXILLA LEFT BREAST    US  LIMITED ULTRASOUND INCLUDING AXILLA RIGHT BREAST   MM 3D DIAGNOSTIC MAMMOGRAM BILATERAL BREAST    Meds: No orders of the defined types were placed in this encounter.   Follow-up: Return in about 1 year (around 01/18/2026) for Physical.  Suzen JONELLE Fetters CNM, WHNP-BC 01/18/2025 9:06 AM  "

## 2025-01-19 LAB — CYTOLOGY - PAP
Comment: NEGATIVE
Diagnosis: NEGATIVE
High risk HPV: NEGATIVE

## 2025-01-23 ENCOUNTER — Ambulatory Visit: Payer: Self-pay | Admitting: Women's Health

## 2025-01-25 ENCOUNTER — Telehealth: Payer: Self-pay | Admitting: *Deleted

## 2025-01-25 MED ORDER — EPINEPHRINE 0.3 MG/0.3ML IJ SOAJ: 0.3000 mg | INTRAMUSCULAR | 1 refills | Status: AC | PRN

## 2025-01-25 NOTE — Telephone Encounter (Signed)
 Rx sent in

## 2025-01-25 NOTE — Telephone Encounter (Signed)
 Received fax from [facility] on 1/28 stating Neffy  requires PA. Okay to refill Epipen ?

## 2025-02-08 ENCOUNTER — Other Ambulatory Visit

## 2025-02-08 ENCOUNTER — Encounter

## 2025-02-13 ENCOUNTER — Ambulatory Visit: Admitting: Family Medicine

## 2025-03-30 ENCOUNTER — Ambulatory Visit: Admitting: Family Medicine

## 2025-08-28 ENCOUNTER — Ambulatory Visit: Admitting: Allergy
